# Patient Record
Sex: Female | Born: 1985 | Race: White | Hispanic: No | Marital: Single | Smoking: Current every day smoker
Health system: Southern US, Community
[De-identification: ages and names within clinical notes are randomized; demographics above are authoritative.]

## PROBLEM LIST (undated history)

## (undated) DIAGNOSIS — R519 Headache, unspecified: Secondary | ICD-10-CM

## (undated) DIAGNOSIS — I83819 Varicose veins of unspecified lower extremities with pain: Secondary | ICD-10-CM

## (undated) DIAGNOSIS — Z789 Other specified health status: Secondary | ICD-10-CM

## (undated) DIAGNOSIS — R011 Cardiac murmur, unspecified: Secondary | ICD-10-CM

## (undated) DIAGNOSIS — Z8759 Personal history of other complications of pregnancy, childbirth and the puerperium: Secondary | ICD-10-CM

## (undated) DIAGNOSIS — L309 Dermatitis, unspecified: Secondary | ICD-10-CM

## (undated) HISTORY — PX: WISDOM TOOTH EXTRACTION: SHX21

## (undated) HISTORY — PX: NO PAST SURGERIES: SHX2092

## (undated) HISTORY — DX: Headache, unspecified: R51.9

---
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
## 2012-01-17 ENCOUNTER — Emergency Department (HOSPITAL_COMMUNITY): Payer: 59

## 2012-01-17 ENCOUNTER — Emergency Department (HOSPITAL_COMMUNITY)
Admission: EM | Admit: 2012-01-17 | Discharge: 2012-01-17 | Disposition: A | Discharge status: Home / Self Care | Payer: 59 | Attending: Emergency Medicine | Admitting: Emergency Medicine

## 2012-01-17 ENCOUNTER — Encounter (HOSPITAL_COMMUNITY): Payer: Self-pay | Admitting: *Deleted

## 2012-01-17 DIAGNOSIS — R509 Fever, unspecified: Secondary | ICD-10-CM | POA: Insufficient documentation

## 2012-01-17 DIAGNOSIS — J029 Acute pharyngitis, unspecified: Secondary | ICD-10-CM | POA: Insufficient documentation

## 2012-01-17 DIAGNOSIS — F17200 Nicotine dependence, unspecified, uncomplicated: Secondary | ICD-10-CM | POA: Insufficient documentation

## 2012-01-17 MED ORDER — DEXAMETHASONE SODIUM PHOSPHATE 4 MG/ML IJ SOLN
8.0000 mg | Freq: Once | INTRAMUSCULAR | Status: AC
  Administered 2012-01-17: 8 mg via INTRAVENOUS
  Filled 2012-01-17: qty 2

## 2012-01-17 MED ORDER — PENICILLIN G BENZATHINE & PROC 1200000 UNIT/2ML IM SUSP
1.2000 10*6.[IU] | Freq: Once | INTRAMUSCULAR | Status: AC
  Administered 2012-01-17: 1.2 10*6.[IU] via INTRAMUSCULAR
  Filled 2012-01-17 (×2): qty 2

## 2012-01-17 MED ORDER — IOHEXOL 300 MG/ML  SOLN
75.0000 mL | Freq: Once | INTRAMUSCULAR | Status: AC | PRN
  Administered 2012-01-17: 75 mL via INTRAVENOUS

## 2012-01-17 NOTE — ED Notes (Signed)
Reports having fever since Monday, throat started hurting last night. [facility], tested negative for strep but sent here for possible abscess, airway intact at triage.

## 2012-01-17 NOTE — ED Notes (Signed)
PA at bedside.

## 2012-01-17 NOTE — ED Notes (Signed)
Medication received from pharmacy. Correct label, wrong medication. Pharmacy contacted.

## 2012-01-17 NOTE — ED Notes (Signed)
Pharmacy contacted for update on injection.

## 2012-01-17 NOTE — ED Notes (Signed)
Report given to Lillia Abed, RN for patient to get antibiotic injection once up from pharmacy.

## 2012-01-17 NOTE — ED Provider Notes (Signed)
History     CSN: [id number]  Arrival date & time 01/17/12  4098   First MD Initiated Contact with Patient 01/17/12 0935      Chief Complaint  Patient presents with  . Sore Throat    (Consider location/radiation/quality/duration/timing/severity/associated sxs/prior treatment) HPI History from patient. 26 year old female who presents with complaint of sore throat. She was seen at urgent care this morning and instructed to present to the emergency department for possible peritonsillar abscess. She had a rapid strep performed at urgent care which she reports was negative. She states that she has had intermittent fevers with MAXIMUM TEMPERATURE of 102 since Monday night, associated with decreased appetite. She did not develop sore throat until yesterday evening. She states it is painful to swallow but she is able to do so. Denies any difficulty breathing. She has had strep as a child but has not had in several years. She denies any associated nausea, vomiting, or abdominal pain. No cough or congestion.  History reviewed. No pertinent past medical history.  History reviewed. No pertinent past surgical history.  History reviewed. No pertinent family history.  History  Substance Use Topics  . Smoking status: Current Everyday Smoker    Types: Cigarettes  . Smokeless tobacco: Not on file  . Alcohol Use: No    OB History    Grav Para Term Preterm Abortions TAB SAB Ect Mult Living                  Review of Systems review of systems as per history of present illness  Allergies  Review of patient's allergies indicates no known allergies.  Home Medications   Current Outpatient Rx  Name Route Sig Dispense Refill  . ETONOGESTREL 68 MG [place] IMPL Subcutaneous Inject 1 each into the skin once.    . IBUPROFEN 200 MG PO TABS Oral Take 800 mg by mouth every 8 (eight) hours as needed. For pain    . OVER THE COUNTER MEDICATION Oral Take 30 mLs by mouth every 4 (four) hours as needed. Cough and  cold medication for cold symptoms      BP 118/72  Pulse 96  Temp 99.5 F (37.5 C) (Oral)  Resp 18  SpO2 97%  LMP 09/08/2011  Physical Exam  Nursing note and vitals reviewed. Constitutional: She appears well-developed and well-nourished. No distress.  HENT:  Head: Normocephalic and atraumatic.       Bilateral tonsillar edema with right slightly greater than left. Bilateral exudates and erythema. Uvula is midline. No stridor noted. Patient handling secretions. No acute distress.  Eyes:       Normal appearance  Neck: Normal range of motion. No tracheal deviation present.  Cardiovascular: Normal rate, regular rhythm and normal heart sounds.   Pulmonary/Chest: Effort normal and breath sounds normal. She exhibits no tenderness.  Abdominal: Soft. There is no tenderness.  Musculoskeletal: Normal range of motion.  Lymphadenopathy:    She has cervical adenopathy.  Neurological: She is alert.  Skin: Skin is warm and dry. She is not diaphoretic.  Psychiatric: She has a normal mood and affect.    ED Course  Procedures (including critical care time)  Labs Reviewed - No data to display Ct Soft Tissue Neck W Contrast  01/17/2012  *RADIOLOGY REPORT*  Clinical Data: Fever.  Difficulty swallowing.  Throat pain.  CT NECK WITH CONTRAST  Technique:  Multidetector CT imaging of the neck was performed with intravenous contrast.  Contrast: 75mL OMNIPAQUE IOHEXOL 300 MG/ML  SOLN  Comparison:  None.  Findings: Prominence of the adenoids and tonsillar pillars noted bilaterally, with moderate prominence of the palatine tonsils and mild lingual tonsillar prominence effacing a portion of the oral airway.  The nasal airway appears patent.  The epiglottis and aryepiglottic folds appear normal, and no prevertebral/retropharyngeal phlegmon or abscess is observed.  No tonsillar abscess is noted and the parapharyngeal spaces appear symmetric.  Numerous reactive lymph nodes are present in the internal jugular chain and  posterior cervical triangles.  Thyroid gland unremarkable.  Slight stranding in the upper mediastinum is observed and is technically nonspecific but may represent residual thymic tissue, with inflammatory process possible but less likely.  Broad and / salivary glands appear symmetric.  Paranasal sinuses and mastoid air cells appear clear.  No significant dental disease observed.  IMPRESSION:  1.  Abnormal prominence of the tonsillar pillars, adenoids, and palatine tonsils bilaterally.  This effaces the oral airway although the nasal airway is patent.  No tonsillar abscess or retropharyngeal/prevertebral abscess noted. 2.  Faint stranding in the anterior upper mediastinum is probably from thymic tissue, with inflammatory process considered less likely.  This area is only partially included.  If definitive workup of the mediastinum is warranted, CT the chest would be recommended. 3.  Mild reactive adenopathy in the neck.  Original Report Authenticated By: Dellia Cloud, M.D.     1. Pharyngitis       MDM  Patient presents with fever and sore throat since last evening. Rapid strep performed at urgent care was reportedly negative. Based on tonsillar edema, CT of the soft tissues of the neck was performed. This is negative for a peritonsillar or retropharyngeal abscess. As patient has not had any coughing or congestion, reports fevers at home, has exudates on exam, she will be presumptively treated for strep. Dose of IM Bicillin given prior to discharge. Reasons to return including signs and symptoms of airway compromise discussed. Patient verbalized understanding and agreed to plan.        Grant Fontana, PA-C 01/17/12 1525

## 2012-01-18 NOTE — ED Provider Notes (Signed)
Medical screening examination/treatment/procedure(s) were performed by non-physician practitioner and as supervising physician I was immediately available for consultation/collaboration.   Nashay Brickley, MD 01/18/12 1332 

## 2012-10-08 ENCOUNTER — Ambulatory Visit (INDEPENDENT_AMBULATORY_CARE_PROVIDER_SITE_OTHER): Payer: 59 | Admitting: Family Medicine

## 2012-10-08 DIAGNOSIS — Z111 Encounter for screening for respiratory tuberculosis: Secondary | ICD-10-CM

## 2012-10-08 MED ORDER — TUBERCULIN PPD 5 UNIT/0.1ML ID SOLN
5.0000 [IU] | Freq: Once | INTRADERMAL | Status: AC
  Administered 2012-10-08: 5 [IU] via INTRADERMAL

## 2012-10-10 ENCOUNTER — Ambulatory Visit: Payer: 59 | Admitting: Family Medicine

## 2012-10-11 ENCOUNTER — Encounter (INDEPENDENT_AMBULATORY_CARE_PROVIDER_SITE_OTHER): Payer: 59 | Admitting: Family Medicine

## 2012-10-11 DIAGNOSIS — Z111 Encounter for screening for respiratory tuberculosis: Secondary | ICD-10-CM

## 2012-10-11 LAB — READ PPD: TB Skin Test: NEGATIVE

## 2012-10-12 NOTE — Progress Notes (Signed)
This encounter was created in error - please disregard. This encounter was created in error - please disregard. This encounter was created in error - please disregard. 

## 2012-10-12 NOTE — Progress Notes (Signed)
This encounter was created in error - please disregard.

## 2012-10-23 ENCOUNTER — Emergency Department (HOSPITAL_COMMUNITY): Payer: 59

## 2012-10-23 ENCOUNTER — Observation Stay (HOSPITAL_COMMUNITY)
Admission: EM | Admit: 2012-10-23 | Discharge: 2012-10-24 | Disposition: A | Discharge status: Home / Self Care | Payer: 59 | Attending: General Surgery | Admitting: General Surgery

## 2012-10-23 ENCOUNTER — Encounter (HOSPITAL_COMMUNITY): Payer: Self-pay | Admitting: Family Medicine

## 2012-10-23 DIAGNOSIS — S20219A Contusion of unspecified front wall of thorax, initial encounter: Principal | ICD-10-CM | POA: Insufficient documentation

## 2012-10-23 DIAGNOSIS — R51 Headache: Secondary | ICD-10-CM | POA: Insufficient documentation

## 2012-10-23 DIAGNOSIS — IMO0001 Reserved for inherently not codable concepts without codable children: Secondary | ICD-10-CM | POA: Insufficient documentation

## 2012-10-23 DIAGNOSIS — M2550 Pain in unspecified joint: Secondary | ICD-10-CM | POA: Insufficient documentation

## 2012-10-23 DIAGNOSIS — F17200 Nicotine dependence, unspecified, uncomplicated: Secondary | ICD-10-CM | POA: Insufficient documentation

## 2012-10-23 DIAGNOSIS — R079 Chest pain, unspecified: Secondary | ICD-10-CM | POA: Diagnosis present

## 2012-10-23 DIAGNOSIS — M25569 Pain in unspecified knee: Secondary | ICD-10-CM | POA: Diagnosis not present

## 2012-10-23 DIAGNOSIS — S4362XA Sprain of left sternoclavicular joint, initial encounter: Secondary | ICD-10-CM

## 2012-10-23 DIAGNOSIS — S161XXA Strain of muscle, fascia and tendon at neck level, initial encounter: Secondary | ICD-10-CM

## 2012-10-23 DIAGNOSIS — S139XXA Sprain of joints and ligaments of unspecified parts of neck, initial encounter: Secondary | ICD-10-CM

## 2012-10-23 DIAGNOSIS — S301XXA Contusion of abdominal wall, initial encounter: Secondary | ICD-10-CM

## 2012-10-23 HISTORY — DX: Other specified health status: Z78.9

## 2012-10-23 LAB — POCT I-STAT, CHEM 8
BUN: 12 mg/dL (ref 6–23)
Calcium, Ion: 1.23 mmol/L (ref 1.12–1.23)
Chloride: 107 mEq/L (ref 96–112)
Glucose, Bld: 104 mg/dL — ABNORMAL HIGH (ref 70–99)
HCT: 44 % (ref 36.0–46.0)
TCO2: 26 mmol/L (ref 0–100)

## 2012-10-23 LAB — HCG, SERUM, QUALITATIVE: Preg, Serum: NEGATIVE

## 2012-10-23 MED ORDER — ONDANSETRON HCL 8 MG PO TABS
4.0000 mg | ORAL_TABLET | Freq: Four times a day (QID) | ORAL | Status: DC | PRN
  Filled 2012-10-23: qty 0.5

## 2012-10-23 MED ORDER — MORPHINE SULFATE 4 MG/ML IJ SOLN
4.0000 mg | Freq: Once | INTRAMUSCULAR | Status: AC
  Administered 2012-10-23: 4 mg via INTRAVENOUS
  Filled 2012-10-23: qty 1

## 2012-10-23 MED ORDER — MORPHINE SULFATE 2 MG/ML IJ SOLN
4.0000 mg | INTRAMUSCULAR | Status: DC | PRN
  Administered 2012-10-23 – 2012-10-24 (×3): 4 mg via INTRAVENOUS
  Filled 2012-10-23 (×3): qty 2

## 2012-10-23 MED ORDER — PANTOPRAZOLE SODIUM 40 MG PO TBEC: 40.0000 mg | DELAYED_RELEASE_TABLET | Freq: Every day | ORAL | Status: DC

## 2012-10-23 MED ORDER — ONDANSETRON HCL 4 MG/2ML IJ SOLN: 4.0000 mg | Freq: Four times a day (QID) | INTRAMUSCULAR | Status: DC | PRN

## 2012-10-23 MED ORDER — IOHEXOL 300 MG/ML  SOLN
100.0000 mL | Freq: Once | INTRAMUSCULAR | Status: AC | PRN
  Administered 2012-10-23: 100 mL via INTRAVENOUS

## 2012-10-23 MED ORDER — PNEUMOCOCCAL VAC POLYVALENT 25 MCG/0.5ML IJ INJ
0.5000 mL | INJECTION | INTRAMUSCULAR | Status: DC
  Filled 2012-10-23: qty 0.5

## 2012-10-23 MED ORDER — PANTOPRAZOLE SODIUM 40 MG IV SOLR
40.0000 mg | Freq: Every day | INTRAVENOUS | Status: DC
  Filled 2012-10-23: qty 40
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
## 2012-10-23 NOTE — ED Notes (Addendum)
Surgeon at bedside.  
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
## 2012-10-23 NOTE — ED Notes (Signed)
Per PA Hannah patient acuity increased to 3 due to patient's status at assessment.

## 2012-10-24 DIAGNOSIS — S20219A Contusion of unspecified front wall of thorax, initial encounter: Secondary | ICD-10-CM | POA: Diagnosis not present

## 2012-10-24 DIAGNOSIS — S301XXA Contusion of abdominal wall, initial encounter: Secondary | ICD-10-CM

## 2012-10-24 DIAGNOSIS — S161XXA Strain of muscle, fascia and tendon at neck level, initial encounter: Secondary | ICD-10-CM

## 2012-10-24 DIAGNOSIS — S4362XA Sprain of left sternoclavicular joint, initial encounter: Secondary | ICD-10-CM

## 2012-10-24 LAB — CBC
HCT: 39 % (ref 36.0–46.0)
MCHC: 34.6 g/dL (ref 30.0–36.0)
MCV: 87.6 fL (ref 78.0–100.0)
Platelets: 252 10*3/uL (ref 150–400)
RDW: 13 % (ref 11.5–15.5)
WBC: 7.9 10*3/uL (ref 4.0–10.5)

## 2012-10-24 LAB — BASIC METABOLIC PANEL
BUN: 10 mg/dL (ref 6–23)
Creatinine, Ser: 0.84 mg/dL (ref 0.50–1.10)
GFR calc Af Amer: >90 mL/min (ref >90)
GFR calc non Af Amer: >90 mL/min (ref >90)

## 2012-10-24 MED ORDER — HYDROCODONE-ACETAMINOPHEN 5-325 MG PO TABS: 1.0000 | ORAL_TABLET | ORAL | Status: DC | PRN

## 2012-10-24 MED ORDER — NAPROXEN 500 MG PO TABS: 500.0000 mg | ORAL_TABLET | Freq: Two times a day (BID) | ORAL | Status: DC

## 2012-10-24 MED ORDER — METHOCARBAMOL 500 MG PO TABS: 500.0000 mg | ORAL_TABLET | Freq: Four times a day (QID) | ORAL | Status: DC | PRN

## 2012-10-24 NOTE — Progress Notes (Signed)
Pt discharged to home
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
## 2012-10-24 NOTE — Discharge Summary (Signed)
Patric Vanpelt, MD, MPH, FACS Pager: [phone]  

## 2012-10-24 NOTE — Progress Notes (Signed)
Doing well, ready for D/C Violeta Gelinas, MD, MPH, FACS Pager: [phone]

## 2012-10-24 NOTE — ED Provider Notes (Signed)
Medical screening examination/treatment/procedure(s) were performed by non-physician practitioner and as supervising physician I was immediately available for consultation/collaboration.   [name], DO 10/24/12 1535

## 2012-11-01 ENCOUNTER — Ambulatory Visit
Admission: RE | Admit: 2012-11-01 | Discharge: 2012-11-01 | Disposition: A | Discharge status: Home / Self Care | Payer: 59 | Source: Ambulatory Visit | Attending: Family Medicine | Admitting: Family Medicine

## 2012-11-01 ENCOUNTER — Encounter: Payer: Self-pay | Admitting: Family Medicine

## 2012-11-01 ENCOUNTER — Ambulatory Visit (INDEPENDENT_AMBULATORY_CARE_PROVIDER_SITE_OTHER): Payer: 59 | Admitting: Family Medicine

## 2012-11-01 VITALS — BP 130/70 | HR 80 | Temp 98.6°F | Resp 18 | Wt 226.0 lb

## 2012-11-01 DIAGNOSIS — M549 Dorsalgia, unspecified: Secondary | ICD-10-CM

## 2012-11-01 DIAGNOSIS — M533 Sacrococcygeal disorders, not elsewhere classified: Secondary | ICD-10-CM

## 2012-11-01 DIAGNOSIS — M25552 Pain in left hip: Secondary | ICD-10-CM

## 2012-11-01 DIAGNOSIS — M542 Cervicalgia: Secondary | ICD-10-CM

## 2012-11-01 DIAGNOSIS — M25559 Pain in unspecified hip: Secondary | ICD-10-CM
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
## 2012-11-06 NOTE — Progress Notes (Signed)
done

## 2012-11-11 ENCOUNTER — Telehealth: Payer: Self-pay | Admitting: Family Medicine

## 2012-11-11 NOTE — Telephone Encounter (Signed)
Patient aware.
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
## 2012-11-12 ENCOUNTER — Other Ambulatory Visit: Payer: Self-pay | Admitting: Family Medicine

## 2012-11-12 MED ORDER — HYDROCODONE-ACETAMINOPHEN 5-325 MG PO TABS: 1.0000 | ORAL_TABLET | ORAL | Status: DC | PRN

## 2012-11-12 MED ORDER — METHOCARBAMOL 500 MG PO TABS: 500.0000 mg | ORAL_TABLET | Freq: Four times a day (QID) | ORAL | Status: DC | PRN

## 2012-11-12 NOTE — Telephone Encounter (Signed)
Rx Refilled  

## 2013-05-15 ENCOUNTER — Other Ambulatory Visit: Payer: Self-pay

## 2013-06-30 ENCOUNTER — Emergency Department (HOSPITAL_COMMUNITY)
Admission: EM | Admit: 2013-06-30 | Discharge: 2013-06-30 | Disposition: A | Discharge status: Home / Self Care | Payer: 59 | Source: Home / Self Care | Attending: Emergency Medicine | Admitting: Emergency Medicine

## 2013-06-30 ENCOUNTER — Encounter (HOSPITAL_COMMUNITY): Payer: Self-pay | Admitting: Emergency Medicine

## 2013-06-30 DIAGNOSIS — J069 Acute upper respiratory infection, unspecified: Secondary | ICD-10-CM

## 2013-06-30 DIAGNOSIS — J209 Acute bronchitis, unspecified: Secondary | ICD-10-CM

## 2013-06-30 DIAGNOSIS — J019 Acute sinusitis, unspecified: Secondary | ICD-10-CM

## 2013-06-30 MED ORDER — HYDROCOD POLST-CHLORPHEN POLST 10-8 MG/5ML PO LQCR: 5.0000 mL | Freq: Two times a day (BID) | ORAL | Status: DC | PRN

## 2013-06-30 MED ORDER — AMOXICILLIN-POT CLAVULANATE 875-125 MG PO TABS: 1.0000 | ORAL_TABLET | Freq: Two times a day (BID) | ORAL | Status: DC

## 2013-06-30 NOTE — ED Notes (Signed)
Pt  Reports      Symptoms       Of  Congested         With  Drainage        From  Sinuses           Pt reports       scrathty  Throat               Drainage  With  Sweating        Symptoms  X  1  Week             Pt  Reports  The  Symptoms           Not releived  By   otc  meds          The  Pt  Is  Sitting upright        On  Exam  Room  In  No  Acute  distress

## 2013-06-30 NOTE — ED Provider Notes (Signed)
Chief Complaint:   Chief Complaint  Patient presents with  . Sore Throat    History of Present Illness:   Kaylee Brown is a 27 year old female who's had a one-week history of sore throat, felt weak and tired, and cough productive of greenish, bloody drainage, chest pain, chills, nasal congestion, sneezing, rhinorrhea with green drainage, headache, sinus pressure, ear congestion, watery eyes, and nausea. She denies any wheezing, vomiting, or diarrhea. She's had no known exposures.  Review of Systems:  Other than noted above, the patient denies any of the following symptoms: Systemic:  No fevers, chills, sweats, weight loss or gain, fatigue, or tiredness. Eye:  No redness or discharge. ENT:  No ear pain, drainage, headache, nasal congestion, drainage, sinus pressure, difficulty swallowing, or sore throat. Neck:  No neck pain or swollen glands. Lungs:  No cough, sputum production, hemoptysis, wheezing, chest tightness, shortness of breath or chest pain. GI:  No abdominal pain, nausea, vomiting or diarrhea.  PMFSH:  Past medical history, family history, social history, meds, and allergies were reviewed.  Physical Exam:   Vital signs:  BP 130/77  Pulse 83  Temp(Src) 98.2 F (36.8 C) (Oral)  Resp 16  SpO2 100%  LMP 05/31/2013 General:  Alert and oriented.  In no distress.  Skin warm and dry. Eye:  No conjunctival injection or drainage. Lids were normal. ENT:  TMs and canals were normal, without erythema or inflammation.  Nasal mucosa was clear and uncongested, without drainage.  Mucous membranes were moist.  Tonsils were enlarged and red with spots of white exudate.  There were no oral ulcerations or lesions. Neck:  Supple, no adenopathy, tenderness or mass. Lungs:  No respiratory distress.  Lungs were clear to auscultation, without wheezes, rales or rhonchi.  Breath sounds were clear and equal bilaterally.  Heart:  Regular rhythm, without gallops, murmers or rubs. Skin:  Clear, warm,  and dry, without rash or lesions.  Labs:   Results for orders placed during the hospital encounter of 06/30/13  POCT RAPID STREP A ([facility] ONLY)      Result Value Range   Streptococcus, Group A Screen (Direct) NEGATIVE  NEGATIVE    Assessment:  The primary encounter diagnosis was Viral upper respiratory infection. Diagnoses of Acute sinusitis and Acute bronchitis were also pertinent to this visit.  Plan:   1.  Meds:  The following meds were prescribed:   Discharge Medication List as of 06/30/2013  5:35 PM    START taking these medications   Details  amoxicillin-clavulanate (AUGMENTIN) 875-125 MG per tablet Take 1 tablet by mouth 2 (two) times daily., Starting 06/30/2013, Until Discontinued, Normal    chlorpheniramine-HYDROcodone (TUSSIONEX) 10-8 MG/5ML LQCR Take 5 mLs by mouth every 12 (twelve) hours as needed for cough., Starting 06/30/2013, Until Discontinued, Normal        2.  Patient Education/Counseling:  The patient was given appropriate handouts, self care instructions, and instructed in symptomatic relief.  Advised extra rest and fluids and suggested decongestants, saline spray, and inhaling steam, and sleeping with head elevated.  3.  Follow up:  The patient was told to follow up if no better in 3 to 4 days, if becoming worse in any way, and given some red flag symptoms such as increasing fever or difficulty breathing which would prompt immediate return.  Follow up here if necessary.      Reuben Likes, MD 06/30/13 [phone]

## 2013-07-02 LAB — CULTURE, GROUP A STREP

## 2013-07-08 ENCOUNTER — Telehealth (HOSPITAL_COMMUNITY): Payer: Self-pay | Admitting: *Deleted

## 2013-07-08 NOTE — ED Notes (Signed)
Pt.'s mother called on VM and said her insurance does not cover the Augmentin or Tussin D and asked for generic.  I called back and told her any of our Rx.'s can be filled as generic and the generic name is on the Rx. in parenthesis-amoxicillin-clauvanate.  I told her that if the insurance does not cover the cough medicine, then use OTC with D which is for decongestant. She said that is what she has been doing that and will call the pharmacist back. Kaylee Brown 07/08/2013

## 2014-04-24 ENCOUNTER — Other Ambulatory Visit: Payer: Self-pay
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
## 2014-11-15 ENCOUNTER — Emergency Department (HOSPITAL_COMMUNITY)
Admission: EM | Admit: 2014-11-15 | Discharge: 2014-11-15 | Disposition: A | Discharge status: Home / Self Care | Payer: Managed Care, Other (non HMO) | Attending: Emergency Medicine | Admitting: Emergency Medicine

## 2014-11-15 ENCOUNTER — Encounter (HOSPITAL_COMMUNITY): Payer: Self-pay | Admitting: Emergency Medicine

## 2014-11-15 DIAGNOSIS — Z720 Tobacco use: Secondary | ICD-10-CM | POA: Diagnosis not present

## 2014-11-15 DIAGNOSIS — Z87828 Personal history of other (healed) physical injury and trauma: Secondary | ICD-10-CM | POA: Insufficient documentation

## 2014-11-15 DIAGNOSIS — J029 Acute pharyngitis, unspecified: Secondary | ICD-10-CM

## 2014-11-15 DIAGNOSIS — Z3202 Encounter for pregnancy test, result negative: Secondary | ICD-10-CM | POA: Diagnosis not present

## 2014-11-15 DIAGNOSIS — R509 Fever, unspecified: Secondary | ICD-10-CM | POA: Diagnosis present

## 2014-11-15 LAB — POC URINE PREG, ED: PREG TEST UR: NEGATIVE

## 2014-11-15 LAB — RAPID STREP SCREEN (MED CTR MEBANE ONLY): STREPTOCOCCUS, GROUP A SCREEN (DIRECT): NEGATIVE

## 2014-11-15 MED ORDER — DEXAMETHASONE 4 MG PO TABS
10.0000 mg | ORAL_TABLET | Freq: Once | ORAL | Status: AC
  Administered 2014-11-15: 10 mg via ORAL
  Filled 2014-11-15: qty 2
  Filled 2014-11-15: qty 1
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
## 2014-11-15 NOTE — Discharge Instructions (Signed)

## 2014-11-15 NOTE — ED Notes (Addendum)
Pt from home c/o sore throat and fever x 2 days. She has been taking nyquil without relief. Right sided throat swelling.

## 2014-11-17 LAB — CULTURE, GROUP A STREP

## 2015-12-07 ENCOUNTER — Ambulatory Visit (INDEPENDENT_AMBULATORY_CARE_PROVIDER_SITE_OTHER): Payer: Managed Care, Other (non HMO) | Admitting: Sports Medicine

## 2015-12-07 ENCOUNTER — Encounter: Payer: Self-pay | Admitting: Sports Medicine

## 2015-12-07 ENCOUNTER — Ambulatory Visit: Payer: Managed Care, Other (non HMO)

## 2015-12-07 VITALS — BP 113/74 | HR 76 | Resp 12

## 2015-12-07 DIAGNOSIS — M5430 Sciatica, unspecified side: Secondary | ICD-10-CM

## 2015-12-07 DIAGNOSIS — M79673 Pain in unspecified foot: Secondary | ICD-10-CM

## 2015-12-07 DIAGNOSIS — M7752 Other enthesopathy of left foot: Secondary | ICD-10-CM

## 2015-12-07 MED ORDER — TRIAMCINOLONE ACETONIDE 10 MG/ML IJ SUSP: 10.0000 mg | Freq: Once | INTRAMUSCULAR | Status: DC
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
## 2016-07-27 DIAGNOSIS — N926 Irregular menstruation, unspecified: Secondary | ICD-10-CM | POA: Insufficient documentation

## 2016-11-16 DIAGNOSIS — L309 Dermatitis, unspecified: Secondary | ICD-10-CM | POA: Diagnosis not present

## 2016-11-16 DIAGNOSIS — R109 Unspecified abdominal pain: Secondary | ICD-10-CM | POA: Diagnosis not present

## 2016-11-16 DIAGNOSIS — R197 Diarrhea, unspecified: Secondary | ICD-10-CM | POA: Diagnosis not present

## 2016-11-17 DIAGNOSIS — R197 Diarrhea, unspecified: Secondary | ICD-10-CM | POA: Diagnosis not present

## 2016-12-19 DIAGNOSIS — R197 Diarrhea, unspecified: Secondary | ICD-10-CM | POA: Diagnosis not present

## 2016-12-21 DIAGNOSIS — L280 Lichen simplex chronicus: Secondary | ICD-10-CM | POA: Diagnosis not present

## 2016-12-21 DIAGNOSIS — L309 Dermatitis, unspecified: Secondary | ICD-10-CM | POA: Diagnosis not present

## 2017-05-03 ENCOUNTER — Encounter (HOSPITAL_COMMUNITY): Payer: Self-pay | Admitting: Emergency Medicine

## 2017-05-03 DIAGNOSIS — M79604 Pain in right leg: Secondary | ICD-10-CM | POA: Diagnosis not present

## 2017-05-03 DIAGNOSIS — R51 Headache: Secondary | ICD-10-CM | POA: Diagnosis not present

## 2017-05-03 DIAGNOSIS — R21 Rash and other nonspecific skin eruption: Secondary | ICD-10-CM | POA: Diagnosis not present

## 2017-05-03 DIAGNOSIS — F17210 Nicotine dependence, cigarettes, uncomplicated: Secondary | ICD-10-CM | POA: Diagnosis not present

## 2017-05-03 LAB — COMPREHENSIVE METABOLIC PANEL
ALK PHOS: 40 U/L (ref 38–126)
ALT: 12 U/L — AB (ref 14–54)
AST: 16 U/L (ref 15–41)
Albumin: 4.1 g/dL (ref 3.5–5.0)
Anion gap: 9 (ref 5–15)
BUN: 13 mg/dL (ref 6–20)
CALCIUM: 9 mg/dL (ref 8.9–10.3)
CHLORIDE: 103 mmol/L (ref 101–111)
CO2: 26 mmol/L (ref 22–32)
CREATININE: 0.76 mg/dL (ref 0.44–1.00)
GFR calc Af Amer: >60 mL/min (ref >60)
Glucose, Bld: 99 mg/dL (ref 65–99)
Potassium: 3.8 mmol/L (ref 3.5–5.1)
Sodium: 138 mmol/L (ref 135–145)
Total Bilirubin: 0.2 mg/dL — ABNORMAL LOW (ref 0.3–1.2)
Total Protein: 7.4 g/dL (ref 6.5–8.1)

## 2017-05-03 LAB — CBC WITH DIFFERENTIAL/PLATELET
BASOS PCT: 1 %
Basophils Absolute: 0.1 10*3/uL (ref 0.0–0.1)
EOS ABS: 0.3 10*3/uL (ref 0.0–0.7)
EOS PCT: 4 %
HCT: 39.2 % (ref 36.0–46.0)
HEMOGLOBIN: 13.2 g/dL (ref 12.0–15.0)
Lymphocytes Relative: 39 %
Lymphs Abs: 3 10*3/uL (ref 0.7–4.0)
MCH: 31.3 pg (ref 26.0–34.0)
MCHC: 33.7 g/dL (ref 30.0–36.0)
MCV: 92.9 fL (ref 78.0–100.0)
MONOS PCT: 8 %
Monocytes Absolute: 0.6 10*3/uL (ref 0.1–1.0)
NEUTROS PCT: 48 %
Neutro Abs: 3.7 10*3/uL (ref 1.7–7.7)
PLATELETS: 314 10*3/uL (ref 150–400)
RBC: 4.22 MIL/uL (ref 3.87–5.11)
RDW: 13.1 % (ref 11.5–15.5)
WBC: 7.7 10*3/uL (ref 4.0–10.5)

## 2017-05-03 LAB — URINALYSIS, ROUTINE W REFLEX MICROSCOPIC
Bilirubin Urine: NEGATIVE
Glucose, UA: NEGATIVE mg/dL
HGB URINE DIPSTICK: NEGATIVE
KETONES UR: NEGATIVE mg/dL
Leukocytes, UA: NEGATIVE
NITRITE: NEGATIVE
PROTEIN: NEGATIVE mg/dL
SPECIFIC GRAVITY, URINE: 1.026 (ref 1.005–1.030)
pH: 5 (ref 5.0–8.0)

## 2017-05-03 LAB — I-STAT CG4 LACTIC ACID, ED: Lactic Acid, Venous: 1.17 mmol/L (ref 0.5–1.9)

## 2017-05-03 NOTE — ED Triage Notes (Signed)
Pt c/o irritated area to the right shin x 2-3 years. area is circular, red with raised bumps. Pt has seen a dermatologist and given steroid creams without relief. Area is warm to the touch, foot is warm, pulses palpable to the right foot. Hx eczema.

## 2017-05-04 ENCOUNTER — Ambulatory Visit (HOSPITAL_BASED_OUTPATIENT_CLINIC_OR_DEPARTMENT_OTHER)
Admission: RE | Admit: 2017-05-04 | Discharge: 2017-05-04 | Disposition: A | Discharge status: Home / Self Care | Payer: BLUE CROSS/BLUE SHIELD | Source: Ambulatory Visit | Attending: Emergency Medicine | Admitting: Emergency Medicine

## 2017-05-04 ENCOUNTER — Emergency Department (HOSPITAL_COMMUNITY)
Admission: EM | Admit: 2017-05-04 | Discharge: 2017-05-04 | Disposition: A | Discharge status: Home / Self Care | Payer: BLUE CROSS/BLUE SHIELD | Attending: Emergency Medicine | Admitting: Emergency Medicine

## 2017-05-04 ENCOUNTER — Emergency Department (HOSPITAL_COMMUNITY): Payer: BLUE CROSS/BLUE SHIELD

## 2017-05-04 DIAGNOSIS — M7989 Other specified soft tissue disorders: Secondary | ICD-10-CM

## 2017-05-04 DIAGNOSIS — R51 Headache: Secondary | ICD-10-CM

## 2017-05-04 DIAGNOSIS — R519 Headache, unspecified: Secondary | ICD-10-CM

## 2017-05-04 DIAGNOSIS — M79609 Pain in unspecified limb: Secondary | ICD-10-CM | POA: Diagnosis not present

## 2017-05-04 DIAGNOSIS — M79604 Pain in right leg: Secondary | ICD-10-CM

## 2017-05-04 MED ORDER — METOCLOPRAMIDE HCL 10 MG PO TABS
10.0000 mg | ORAL_TABLET | Freq: Once | ORAL | Status: AC
  Administered 2017-05-04: 10 mg via ORAL
  Filled 2017-05-04: qty 1

## 2017-05-04 MED ORDER — ACETAMINOPHEN 500 MG PO TABS: 1000.0000 mg | ORAL_TABLET | Freq: Once | ORAL | Status: DC

## 2017-05-04 MED ORDER — DIPHENHYDRAMINE HCL 25 MG PO CAPS
25.0000 mg | ORAL_CAPSULE | Freq: Once | ORAL | Status: AC
  Administered 2017-05-04: 25 mg via ORAL
  Filled 2017-05-04: qty 1

## 2017-05-04 MED ORDER — KETOROLAC TROMETHAMINE 30 MG/ML IJ SOLN
30.0000 mg | Freq: Once | INTRAMUSCULAR | Status: AC
  Administered 2017-05-04: 30 mg via INTRAMUSCULAR
  Filled 2017-05-04: qty 1
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
## 2017-05-11 DIAGNOSIS — I8311 Varicose veins of right lower extremity with inflammation: Secondary | ICD-10-CM | POA: Diagnosis not present

## 2017-05-16 DIAGNOSIS — I8311 Varicose veins of right lower extremity with inflammation: Secondary | ICD-10-CM | POA: Diagnosis not present

## 2017-06-06 DIAGNOSIS — I8311 Varicose veins of right lower extremity with inflammation: Secondary | ICD-10-CM | POA: Diagnosis not present

## 2017-07-10 DIAGNOSIS — R05 Cough: Secondary | ICD-10-CM | POA: Diagnosis not present

## 2017-07-10 DIAGNOSIS — J069 Acute upper respiratory infection, unspecified: Secondary | ICD-10-CM | POA: Diagnosis not present

## 2017-08-17 DIAGNOSIS — Z01419 Encounter for gynecological examination (general) (routine) without abnormal findings: Secondary | ICD-10-CM | POA: Diagnosis not present

## 2017-08-17 DIAGNOSIS — Z1389 Encounter for screening for other disorder: Secondary | ICD-10-CM | POA: Diagnosis not present

## 2017-08-17 DIAGNOSIS — Z720 Tobacco use: Secondary | ICD-10-CM | POA: Diagnosis not present

## 2017-08-17 DIAGNOSIS — Z130 Encounter for screening for diseases of the blood and blood-forming organs and certain disorders involving the immune mechanism: Secondary | ICD-10-CM | POA: Diagnosis not present

## 2017-08-17 DIAGNOSIS — Z124 Encounter for screening for malignant neoplasm of cervix: Secondary | ICD-10-CM | POA: Diagnosis not present

## 2017-08-17 DIAGNOSIS — Z6832 Body mass index (BMI) 32.0-32.9, adult: Secondary | ICD-10-CM | POA: Diagnosis not present

## 2017-08-17 DIAGNOSIS — Z1151 Encounter for screening for human papillomavirus (HPV): Secondary | ICD-10-CM | POA: Diagnosis not present

## 2018-02-05 DIAGNOSIS — R05 Cough: Secondary | ICD-10-CM | POA: Diagnosis not present

## 2018-06-10 DIAGNOSIS — J0100 Acute maxillary sinusitis, unspecified: Secondary | ICD-10-CM | POA: Diagnosis not present

## 2018-06-10 DIAGNOSIS — R05 Cough: Secondary | ICD-10-CM | POA: Diagnosis not present

## 2018-11-19 DIAGNOSIS — O200 Threatened abortion: Secondary | ICD-10-CM | POA: Diagnosis not present

## 2018-11-21 DIAGNOSIS — O200 Threatened abortion: Secondary | ICD-10-CM | POA: Diagnosis not present

## 2018-12-03 DIAGNOSIS — O021 Missed abortion: Secondary | ICD-10-CM | POA: Diagnosis not present

## 2018-12-19 DIAGNOSIS — Z03818 Encounter for observation for suspected exposure to other biological agents ruled out: Secondary | ICD-10-CM | POA: Diagnosis not present

## 2019-01-20 DIAGNOSIS — M25552 Pain in left hip: Secondary | ICD-10-CM | POA: Diagnosis not present

## 2019-03-04 DIAGNOSIS — Q219 Congenital malformation of cardiac septum, unspecified: Secondary | ICD-10-CM | POA: Insufficient documentation

## 2019-03-04 DIAGNOSIS — Z3201 Encounter for pregnancy test, result positive: Secondary | ICD-10-CM | POA: Diagnosis not present

## 2019-03-04 DIAGNOSIS — N911 Secondary amenorrhea: Secondary | ICD-10-CM | POA: Diagnosis not present

## 2019-03-26 DIAGNOSIS — Z363 Encounter for antenatal screening for malformations: Secondary | ICD-10-CM | POA: Diagnosis not present

## 2019-03-26 DIAGNOSIS — Z113 Encounter for screening for infections with a predominantly sexual mode of transmission: Secondary | ICD-10-CM | POA: Diagnosis not present

## 2019-03-26 DIAGNOSIS — Z3682 Encounter for antenatal screening for nuchal translucency: Secondary | ICD-10-CM | POA: Diagnosis not present

## 2019-03-26 DIAGNOSIS — O26891 Other specified pregnancy related conditions, first trimester: Secondary | ICD-10-CM | POA: Diagnosis not present

## 2019-03-26 DIAGNOSIS — Z3A09 9 weeks gestation of pregnancy: Secondary | ICD-10-CM | POA: Diagnosis not present

## 2019-03-26 DIAGNOSIS — Z3689 Encounter for other specified antenatal screening: Secondary | ICD-10-CM | POA: Diagnosis not present

## 2019-03-26 LAB — OB RESULTS CONSOLE RPR: RPR: NONREACTIVE

## 2019-03-26 LAB — OB RESULTS CONSOLE ANTIBODY SCREEN: Antibody Screen: NEGATIVE

## 2019-03-26 LAB — OB RESULTS CONSOLE HIV ANTIBODY (ROUTINE TESTING): HIV: NONREACTIVE

## 2019-03-26 LAB — OB RESULTS CONSOLE ABO/RH: RH Type: POSITIVE

## 2019-03-26 LAB — OB RESULTS CONSOLE GC/CHLAMYDIA
Chlamydia: NEGATIVE
Gonorrhea: NEGATIVE

## 2019-03-26 LAB — OB RESULTS CONSOLE RUBELLA ANTIBODY, IGM: Rubella: NON-IMMUNE/NOT IMMUNE

## 2019-03-26 LAB — OB RESULTS CONSOLE HEPATITIS B SURFACE ANTIGEN: Hepatitis B Surface Ag: NEGATIVE

## 2019-04-18 DIAGNOSIS — Z3481 Encounter for supervision of other normal pregnancy, first trimester: Secondary | ICD-10-CM | POA: Diagnosis not present

## 2019-04-18 DIAGNOSIS — Z3482 Encounter for supervision of other normal pregnancy, second trimester: Secondary | ICD-10-CM | POA: Diagnosis not present

## 2019-04-28 DIAGNOSIS — R509 Fever, unspecified: Secondary | ICD-10-CM | POA: Diagnosis not present

## 2019-04-28 DIAGNOSIS — Z20828 Contact with and (suspected) exposure to other viral communicable diseases: Secondary | ICD-10-CM | POA: Diagnosis not present

## 2019-05-01 DIAGNOSIS — U071 COVID-19: Secondary | ICD-10-CM | POA: Diagnosis not present

## 2019-06-03 DIAGNOSIS — Z3A19 19 weeks gestation of pregnancy: Secondary | ICD-10-CM | POA: Diagnosis not present

## 2019-06-03 DIAGNOSIS — Z363 Encounter for antenatal screening for malformations: Secondary | ICD-10-CM | POA: Diagnosis not present

## 2019-06-27 ENCOUNTER — Inpatient Hospital Stay (HOSPITAL_BASED_OUTPATIENT_CLINIC_OR_DEPARTMENT_OTHER)
Admit: 2019-06-27 | Discharge: 2019-06-27 | Disposition: A | Discharge status: Home / Self Care | Payer: BC Managed Care – PPO | Attending: Advanced Practice Midwife | Admitting: Advanced Practice Midwife

## 2019-06-27 ENCOUNTER — Inpatient Hospital Stay (HOSPITAL_COMMUNITY)
Admission: AD | Admit: 2019-06-27 | Discharge: 2019-06-27 | Disposition: A | Discharge status: Home / Self Care | Payer: BC Managed Care – PPO | Attending: Obstetrics and Gynecology | Admitting: Obstetrics and Gynecology

## 2019-06-27 ENCOUNTER — Encounter (HOSPITAL_COMMUNITY): Payer: Self-pay | Admitting: Obstetrics and Gynecology

## 2019-06-27 ENCOUNTER — Other Ambulatory Visit: Payer: Self-pay

## 2019-06-27 DIAGNOSIS — M7989 Other specified soft tissue disorders: Secondary | ICD-10-CM

## 2019-06-27 DIAGNOSIS — Z3A22 22 weeks gestation of pregnancy: Secondary | ICD-10-CM

## 2019-06-27 DIAGNOSIS — M79609 Pain in unspecified limb: Secondary | ICD-10-CM

## 2019-06-27 DIAGNOSIS — M79661 Pain in right lower leg: Secondary | ICD-10-CM | POA: Insufficient documentation

## 2019-06-27 DIAGNOSIS — R600 Localized edema: Secondary | ICD-10-CM | POA: Insufficient documentation

## 2019-06-27 DIAGNOSIS — Z791 Long term (current) use of non-steroidal anti-inflammatories (NSAID): Secondary | ICD-10-CM | POA: Diagnosis not present

## 2019-06-27 DIAGNOSIS — O26892 Other specified pregnancy related conditions, second trimester: Secondary | ICD-10-CM | POA: Diagnosis not present

## 2019-06-27 DIAGNOSIS — I83891 Varicose veins of right lower extremities with other complications: Secondary | ICD-10-CM | POA: Diagnosis not present

## 2019-06-27 DIAGNOSIS — Z87891 Personal history of nicotine dependence: Secondary | ICD-10-CM | POA: Diagnosis not present

## 2019-06-27 HISTORY — DX: Varicose veins of unspecified lower extremity with pain: I83.819

## 2019-06-27 HISTORY — DX: Cardiac murmur, unspecified: R01.1

## 2019-06-27 HISTORY — DX: Dermatitis, unspecified: L30.9
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
## 2019-06-27 NOTE — Discharge Instructions (Signed)
How to Use Compression Stockings Compression stockings are elastic socks that squeeze the legs. They help increase blood flow (circulation) to the legs, decrease swelling in the legs, and reduce the chance of developing blood clots in the lower legs. Compression stockings are often used by people who:  Are recovering from surgery.  Have poor circulation in their legs.  Tend to get blood clots in their legs.  Have bulging (varicose) veins.  Sit or stay in bed for long periods of time. Follow instructions from your health care provider about how and when to wear your compression stockings. How to wear compression stockings Before you put on your compression stockings:  Make sure that they are the correct size and degree of compression. If you do not know your size or required grade of compression, ask your health care provider and follow the manufacturer's instructions that come with the stockings.  Make sure that they are clean, dry, and in good condition.  Check them for rips and tears. Do not put them on if they are ripped or torn. Put your stockings on first thing in the morning, before you get out of bed. Keep them on for as long as your health care provider advises. When you are wearing your stockings:  Keep them as smooth as possible. Do not allow them to bunch up. It is especially important to prevent the stockings from bunching up around your toes or behind your knees.  Do not roll the stockings downward and leave them rolled down. This can decrease blood flow to your leg.  Change them right away if they become wet or dirty. When you take off your stockings, inspect your legs and feet. Check for:  Open sores.  Red spots.  Swelling. General tips  Do not stop wearing compression stockings without talking to your health care provider first.  Wash your stockings every day with mild detergent in cold or warm water. Do not use bleach. Air-dry your stockings or dry them in a  clothes dryer on low heat. It may be helpful to have two pairs so that you have a pair to wear while the other is being washed.  Replace your stockings every 3-6 months.  If skin moisturizing is part of your treatment plan, apply lotion or cream at night so that your skin will be dry when you put on the stockings in the morning. It is harder to put the stockings on when you have lotion on your legs or feet.  Wear nonskid shoes or slip-resistant socks when walking while wearing compression stockings. Contact a health care provider and remove your stockings if you have:  A feeling of pins and needles in your feet or legs.  Open sores, red spots, or other skin changes on your feet or legs.  Swelling or pain that gets worse. Get help right away if you have:  Numbness or tingling in your lower legs that does not get better right after you take the stockings off.  Toes or feet that are unusually cold or turn a bluish color.  A warm or red area on your leg.  New swelling or soreness in your leg.  Shortness of breath.  Chest pain.  A fast or irregular heartbeat.  Light-headedness.  Dizziness. Summary  Compression stockings are elastic socks that squeeze the legs.  They help increase blood flow (circulation) to the legs, decrease swelling in the legs, and reduce the chance of developing blood clots in the lower legs.  Follow instructions  from your health care provider about how and when to wear your compression stockings.  Do not stop wearing your compression stockings without talking to your health care provider first. This information is not intended to replace advice given to you by your health care provider. Make sure you discuss any questions you have with your health care provider. Document Released: 04/23/2009 Document Revised: 06/28/2017 Document Reviewed: 06/28/2017 Elsevier Patient Education  2020 [employer].

## 2019-06-27 NOTE — MAU Note (Signed)
Past few days when she has gotten off work, her varicose veins has been more swollen, painful.   Area above rt inner ankle, is swollen and mottled/bruised  Appearance, popped up in the last couple days. Has had legs elevated since got off last night.

## 2019-06-27 NOTE — Progress Notes (Signed)
Right lower extremity venous duplex completed. Refer to "CV Proc" under chart review to view preliminary results.  06/27/2019 4:33 PM Kelby Aline., MHA, RVT, RDCS, RDMS

## 2019-06-30 DIAGNOSIS — Z362 Encounter for other antenatal screening follow-up: Secondary | ICD-10-CM | POA: Diagnosis not present

## 2019-06-30 DIAGNOSIS — Z3A23 23 weeks gestation of pregnancy: Secondary | ICD-10-CM | POA: Diagnosis not present

## 2019-07-10 DIAGNOSIS — Z362 Encounter for other antenatal screening follow-up: Secondary | ICD-10-CM | POA: Diagnosis not present

## 2019-07-10 DIAGNOSIS — Z3A24 24 weeks gestation of pregnancy: Secondary | ICD-10-CM | POA: Diagnosis not present

## 2019-07-28 DIAGNOSIS — Z3689 Encounter for other specified antenatal screening: Secondary | ICD-10-CM | POA: Diagnosis not present

## 2019-07-28 DIAGNOSIS — Z23 Encounter for immunization: Secondary | ICD-10-CM | POA: Diagnosis not present

## 2019-08-25 ENCOUNTER — Other Ambulatory Visit (HOSPITAL_COMMUNITY): Payer: Self-pay | Admitting: Obstetrics and Gynecology

## 2019-08-25 ENCOUNTER — Encounter (HOSPITAL_COMMUNITY): Payer: Self-pay | Admitting: Obstetrics and Gynecology

## 2019-08-25 ENCOUNTER — Inpatient Hospital Stay (HOSPITAL_COMMUNITY)
Admission: AD | Admit: 2019-08-25 | Discharge: 2019-08-25 | Disposition: A | Discharge status: Other Acute Inpatient Hospital | Payer: BC Managed Care – PPO | Attending: Obstetrics and Gynecology | Admitting: Obstetrics and Gynecology

## 2019-08-25 ENCOUNTER — Other Ambulatory Visit: Payer: Self-pay

## 2019-08-25 ENCOUNTER — Inpatient Hospital Stay (HOSPITAL_BASED_OUTPATIENT_CLINIC_OR_DEPARTMENT_OTHER)
Admission: RE | Admit: 2019-08-25 | Discharge: 2019-08-25 | Disposition: A | Discharge status: Home / Self Care | Payer: BC Managed Care – PPO | Source: Ambulatory Visit | Attending: Obstetrics and Gynecology | Admitting: Obstetrics and Gynecology

## 2019-08-25 DIAGNOSIS — M79604 Pain in right leg: Secondary | ICD-10-CM | POA: Insufficient documentation

## 2019-08-25 DIAGNOSIS — Z3A31 31 weeks gestation of pregnancy: Secondary | ICD-10-CM | POA: Insufficient documentation

## 2019-08-25 DIAGNOSIS — O99891 Other specified diseases and conditions complicating pregnancy: Secondary | ICD-10-CM | POA: Insufficient documentation

## 2019-08-25 DIAGNOSIS — O26893 Other specified pregnancy related conditions, third trimester: Secondary | ICD-10-CM

## 2019-08-25 DIAGNOSIS — M7989 Other specified soft tissue disorders: Secondary | ICD-10-CM

## 2019-08-25 NOTE — MAU Provider Note (Addendum)
First Provider Initiated Contact with Patient 08/25/19 1631     S [name] is a 34 y.o. G2P0010 pregnant female @[redacted]w[redacted]d  who presents to MAU today with complaint of right leg pain. Pt reports she was seen in the office today and was told to come to the hospital for evaluation. The RN called the vascular center and was told that the patient had an appointment scheduled there by her OB at 4pm today, but they would still see her at this time. Pt was not meant to c ome to MAU for evaluation, but rather to present to vascular center for a DVT scan per her OB.   O BP 124/62   Pulse 81   Temp 98.5 F (36.9 C)   Resp 18   Ht 5\' 7"  (1.702 m)   Wt 97.1 kg   BMI 33.52 kg/m    Patient Vitals for the past 24 hrs:  BP Temp Pulse Resp Height Weight  08/25/19 1612 124/62 98.5 F (36.9 C) 81 18 5\' 7"  (1.702 m) 97.1 kg   Physical Exam  Constitutional: She is oriented to person, place, and time. She appears well-developed and well-nourished. No distress.  HENT:  Head: Normocephalic and atraumatic.  Respiratory: Effort normal.  Neurological: She is alert and oriented to person, place, and time.  Skin: She is not diaphoretic.  Psychiatric: She has a normal mood and affect. Her behavior is normal. Judgment and thought content normal.   A Pregnant female R/O DVT FHT 140 Medical screening exam complete  P Discharge from MAU in stable condition Pt to proceed immediately to vascular center for imaging Warning signs for worsening condition that would warrant emergency follow-up discussed Patient may return to MAU as needed for pregnancy related complaints  Dai Apel, , NP 08/25/2019 5:06 PM

## 2019-08-25 NOTE — Progress Notes (Signed)
Right lower extremity venous duplex exam completed.  Preliminary results can be found under CV proc under chart review.  08/25/2019 5:09 PM  Llewellyn Schoenberger, K., RDMS, RVT

## 2019-08-25 NOTE — MAU Note (Signed)
Pt supposed to go to out pt vascular lab for U/S. Escorted pt to vascular lab.

## 2019-08-25 NOTE — MAU Note (Signed)
Pt stated she has had varicose vein problems in her right leg even before she was pregnant . Progressively gotten worse with pregnancy. Sometimes after working pain is so bad it is hard to walk. RLL swollen and warm to touch.

## 2019-09-29 DIAGNOSIS — Z3685 Encounter for antenatal screening for Streptococcus B: Secondary | ICD-10-CM | POA: Diagnosis not present

## 2019-09-29 LAB — OB RESULTS CONSOLE GBS: GBS: NEGATIVE

## 2019-10-16 ENCOUNTER — Telehealth (HOSPITAL_COMMUNITY): Payer: Self-pay | Admitting: *Deleted

## 2019-10-16 ENCOUNTER — Encounter (HOSPITAL_COMMUNITY): Payer: Self-pay | Admitting: *Deleted

## 2019-10-16 NOTE — Telephone Encounter (Signed)
Preadmission screen  

## 2019-10-21 ENCOUNTER — Telehealth (HOSPITAL_COMMUNITY): Payer: Self-pay | Admitting: *Deleted

## 2019-10-21 ENCOUNTER — Encounter (HOSPITAL_COMMUNITY): Payer: Self-pay | Admitting: *Deleted

## 2019-10-21 NOTE — Telephone Encounter (Signed)
Preadmission screen  

## 2019-10-22 DIAGNOSIS — Z3A39 39 weeks gestation of pregnancy: Secondary | ICD-10-CM | POA: Diagnosis not present

## 2019-10-22 DIAGNOSIS — O139 Gestational [pregnancy-induced] hypertension without significant proteinuria, unspecified trimester: Secondary | ICD-10-CM | POA: Diagnosis not present

## 2019-10-23 ENCOUNTER — Other Ambulatory Visit: Payer: Self-pay

## 2019-10-23 ENCOUNTER — Other Ambulatory Visit: Payer: Self-pay | Admitting: Obstetrics and Gynecology

## 2019-10-23 NOTE — H&P (Deleted)
  The note originally documented on this encounter has been moved the the encounter in which it belongs.  
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
## 2019-10-24 ENCOUNTER — Inpatient Hospital Stay (HOSPITAL_COMMUNITY)
Admission: AD | Admit: 2019-10-24 | Discharge: 2019-10-27 | DRG: 788 | Disposition: A | Discharge status: Home / Self Care | Payer: BC Managed Care – PPO | Attending: Obstetrics and Gynecology | Admitting: Obstetrics and Gynecology

## 2019-10-24 ENCOUNTER — Inpatient Hospital Stay (HOSPITAL_COMMUNITY): Payer: BC Managed Care – PPO

## 2019-10-24 ENCOUNTER — Encounter (HOSPITAL_COMMUNITY): Payer: Self-pay | Admitting: Obstetrics and Gynecology

## 2019-10-24 DIAGNOSIS — Z3A39 39 weeks gestation of pregnancy: Secondary | ICD-10-CM | POA: Diagnosis not present

## 2019-10-24 DIAGNOSIS — Z20822 Contact with and (suspected) exposure to covid-19: Secondary | ICD-10-CM | POA: Diagnosis present

## 2019-10-24 DIAGNOSIS — Z87891 Personal history of nicotine dependence: Secondary | ICD-10-CM | POA: Diagnosis not present

## 2019-10-24 DIAGNOSIS — O134 Gestational [pregnancy-induced] hypertension without significant proteinuria, complicating childbirth: Principal | ICD-10-CM | POA: Diagnosis present

## 2019-10-24 DIAGNOSIS — O133 Gestational [pregnancy-induced] hypertension without significant proteinuria, third trimester: Secondary | ICD-10-CM | POA: Diagnosis present

## 2019-10-24 DIAGNOSIS — O365930 Maternal care for other known or suspected poor fetal growth, third trimester, not applicable or unspecified: Secondary | ICD-10-CM | POA: Diagnosis not present

## 2019-10-24 DIAGNOSIS — Z98891 History of uterine scar from previous surgery: Secondary | ICD-10-CM | POA: Diagnosis not present

## 2019-10-24 DIAGNOSIS — E669 Obesity, unspecified: Secondary | ICD-10-CM | POA: Diagnosis not present

## 2019-10-24 DIAGNOSIS — O99214 Obesity complicating childbirth: Secondary | ICD-10-CM | POA: Diagnosis present

## 2019-10-24 DIAGNOSIS — R030 Elevated blood-pressure reading, without diagnosis of hypertension: Secondary | ICD-10-CM | POA: Diagnosis not present

## 2019-10-24 LAB — COMPREHENSIVE METABOLIC PANEL
ALT: 14 U/L (ref 0–44)
AST: 18 U/L (ref 15–41)
Albumin: 2.9 g/dL — ABNORMAL LOW (ref 3.5–5.0)
Alkaline Phosphatase: 94 U/L (ref 38–126)
Anion gap: 11 (ref 5–15)
BUN: 14 mg/dL (ref 6–20)
CO2: 19 mmol/L — ABNORMAL LOW (ref 22–32)
Calcium: 9.2 mg/dL (ref 8.9–10.3)
Chloride: 107 mmol/L (ref 98–111)
Creatinine, Ser: 0.72 mg/dL (ref 0.44–1.00)
GFR calc Af Amer: >60 mL/min (ref >60)
GFR calc non Af Amer: >60 mL/min (ref >60)
Glucose, Bld: 91 mg/dL (ref 70–99)
Potassium: 3.7 mmol/L (ref 3.5–5.1)
Sodium: 137 mmol/L (ref 135–145)
Total Bilirubin: <0.1 mg/dL — ABNORMAL LOW (ref 0.3–1.2)
Total Protein: 6.4 g/dL — ABNORMAL LOW (ref 6.5–8.1)

## 2019-10-24 LAB — PROTEIN / CREATININE RATIO, URINE
Creatinine, Urine: 195 mg/dL
Protein Creatinine Ratio: 0.11 mg/mg{Cre} (ref 0.00–0.15)
Total Protein, Urine: 21 mg/dL

## 2019-10-24 LAB — TYPE AND SCREEN
ABO/RH(D): A POS
Antibody Screen: NEGATIVE

## 2019-10-24 LAB — RESPIRATORY PANEL BY RT PCR (FLU A&B, COVID)
Influenza A by PCR: NEGATIVE
Influenza B by PCR: NEGATIVE
SARS Coronavirus 2 by RT PCR: NEGATIVE

## 2019-10-24 LAB — RPR: RPR Ser Ql: NONREACTIVE

## 2019-10-24 LAB — CBC
HCT: 36.7 % (ref 36.0–46.0)
Hemoglobin: 12.1 g/dL (ref 12.0–15.0)
MCH: 31 pg (ref 26.0–34.0)
MCHC: 33 g/dL (ref 30.0–36.0)
MCV: 94.1 fL (ref 80.0–100.0)
Platelets: 236 10*3/uL (ref 150–400)
RBC: 3.9 MIL/uL (ref 3.87–5.11)
RDW: 12.8 % (ref 11.5–15.5)
WBC: 9.6 10*3/uL (ref 4.0–10.5)
nRBC: 0 % (ref 0.0–0.2)

## 2019-10-24 LAB — ABO/RH: ABO/RH(D): A POS

## 2019-10-24 MED ORDER — BUTORPHANOL TARTRATE 1 MG/ML IJ SOLN
1.0000 mg | INTRAMUSCULAR | Status: DC | PRN
  Administered 2019-10-24 – 2019-10-25 (×5): 1 mg via INTRAVENOUS
  Filled 2019-10-24 (×5): qty 1

## 2019-10-24 MED ORDER — ONDANSETRON HCL 4 MG/2ML IJ SOLN
4.0000 mg | Freq: Four times a day (QID) | INTRAMUSCULAR | Status: DC | PRN
  Administered 2019-10-25: 4 mg via INTRAVENOUS

## 2019-10-24 MED ORDER — OXYCODONE-ACETAMINOPHEN 5-325 MG PO TABS: 1.0000 | ORAL_TABLET | ORAL | Status: DC | PRN

## 2019-10-24 MED ORDER — LACTATED RINGERS IV SOLN
500.0000 mL | INTRAVENOUS | Status: DC | PRN
  Administered 2019-10-25 (×4): 500 mL via INTRAVENOUS

## 2019-10-24 MED ORDER — LACTATED RINGERS IV SOLN: INTRAVENOUS | Status: DC

## 2019-10-24 MED ORDER — OXYCODONE-ACETAMINOPHEN 5-325 MG PO TABS: 2.0000 | ORAL_TABLET | ORAL | Status: DC | PRN

## 2019-10-24 MED ORDER — SOD CITRATE-CITRIC ACID 500-334 MG/5ML PO SOLN
30.0000 mL | ORAL | Status: DC | PRN
  Administered 2019-10-25: 30 mL via ORAL
  Filled 2019-10-24: qty 30

## 2019-10-24 MED ORDER — OXYTOCIN 40 UNITS IN NORMAL SALINE INFUSION - SIMPLE MED
1.0000 m[IU]/min | INTRAVENOUS | Status: DC
  Administered 2019-10-24: 2 m[IU]/min via INTRAVENOUS
  Filled 2019-10-24: qty 1000

## 2019-10-24 MED ORDER — LIDOCAINE HCL (PF) 1 % IJ SOLN: 30.0000 mL | INTRAMUSCULAR | Status: DC | PRN

## 2019-10-24 MED ORDER — OXYTOCIN BOLUS FROM INFUSION: 500.0000 mL | Freq: Once | INTRAVENOUS | Status: DC

## 2019-10-24 MED ORDER — OXYTOCIN 40 UNITS IN NORMAL SALINE INFUSION - SIMPLE MED: 2.5000 [IU]/h | INTRAVENOUS | Status: DC

## 2019-10-24 MED ORDER — MISOPROSTOL 50MCG HALF TABLET
50.0000 ug | ORAL_TABLET | Freq: Once | ORAL | Status: AC
  Administered 2019-10-24: 50 ug via ORAL
  Filled 2019-10-24: qty 1

## 2019-10-24 MED ORDER — TERBUTALINE SULFATE 1 MG/ML IJ SOLN: 0.2500 mg | Freq: Once | INTRAMUSCULAR | Status: DC | PRN

## 2019-10-24 MED ORDER — ACETAMINOPHEN 325 MG PO TABS: 650.0000 mg | ORAL_TABLET | ORAL | Status: DC | PRN

## 2019-10-24 MED ORDER — MISOPROSTOL 25 MCG QUARTER TABLET
25.0000 ug | ORAL_TABLET | ORAL | Status: DC | PRN
  Administered 2019-10-24 (×2): 25 ug via VAGINAL
  Filled 2019-10-24 (×2): qty 1

## 2019-10-24 NOTE — Progress Notes (Signed)
Patient ID: Kaylee Brown, female   DOB: 1985-11-01, 34 y.o.   MRN: [id number] Pt feeling cramping  FHR category 1  Cervix 1/40/-3  Foley bulb placed without difficulty  Pitocin at 10 mu and will continue

## 2019-10-24 NOTE — Progress Notes (Signed)
2nd dose of vaginal cytotec placed VE-cl/th/-4

## 2019-10-24 NOTE — Progress Notes (Signed)
Patient ID: Kaylee Brown, female   DOB: 21-Dec-1985, 34 y.o.   MRN: [id number] Pt with increasing discomfort with foley bulb in, would like more stadol  afeb VSS FHR category 1  Gentle pull on foley and no movement Pitocin at 10 mu Continue to follow

## 2019-10-24 NOTE — MAU Note (Signed)
Covid swab obtained without difficulty and pt tol well. No symptoms 

## 2019-10-24 NOTE — Progress Notes (Signed)
Patient ID: Kaylee Brown, female   DOB: 1986-04-01, 34 y.o.   MRN: [id number] Pt received 2nd dose of cytotec two hours ago and feeling minimal cramping  afeb  BP has been WNL since admission and all PIH labs WNL  Cervical exam deferred, will check when time for next dose to determine treatment plan  FHR category 1--baseline 130

## 2019-10-24 NOTE — Progress Notes (Signed)
Patient ID: Kaylee Brown, female   DOB: 1985/11/29, 34 y.o.   MRN: [id number] Late entry note  Pt feeling mild cramping after oral cytotec  FHR category 1 Cervix long/0.5/-3  Attempted foley bulb placement again and still unable to pass Pitocin started at 230pm and will reattempt foley later this afternoon

## 2019-10-24 NOTE — Progress Notes (Signed)
Foley bulb attempted by Dr Senaida Ores Attempt unsuccessful

## 2019-10-24 NOTE — Progress Notes (Signed)
Patient ID: Kaylee Brown, female   DOB: [birth year], 34 y.o.   MRN: [id number] Pt feeling some cramping  afebn VSS FHR category 1  Cervix long/0/5/posterior/firm Attempted to place foley bulb but would not thread through Will give cytotec po x another dose when due then try again later

## 2019-10-25 ENCOUNTER — Inpatient Hospital Stay (HOSPITAL_COMMUNITY): Payer: BC Managed Care – PPO | Admitting: Anesthesiology

## 2019-10-25 ENCOUNTER — Encounter (HOSPITAL_COMMUNITY)
Admission: AD | Disposition: A | Discharge status: Home / Self Care | Payer: Self-pay | Source: Home / Self Care | Attending: Obstetrics and Gynecology

## 2019-10-25 ENCOUNTER — Encounter (HOSPITAL_COMMUNITY): Payer: Self-pay | Admitting: Obstetrics and Gynecology

## 2019-10-25 DIAGNOSIS — Z3A39 39 weeks gestation of pregnancy: Secondary | ICD-10-CM | POA: Diagnosis not present

## 2019-10-25 DIAGNOSIS — O134 Gestational [pregnancy-induced] hypertension without significant proteinuria, complicating childbirth: Secondary | ICD-10-CM | POA: Diagnosis not present

## 2019-10-25 DIAGNOSIS — R030 Elevated blood-pressure reading, without diagnosis of hypertension: Secondary | ICD-10-CM | POA: Diagnosis not present

## 2019-10-25 LAB — CBC WITH DIFFERENTIAL/PLATELET
Abs Immature Granulocytes: 0.04 10*3/uL (ref 0.00–0.07)
Basophils Absolute: 0 10*3/uL (ref 0.0–0.1)
Basophils Relative: 0 %
Eosinophils Absolute: 0.1 10*3/uL (ref 0.0–0.5)
Eosinophils Relative: 1 %
HCT: 35.9 % — ABNORMAL LOW (ref 36.0–46.0)
Hemoglobin: 11.9 g/dL — ABNORMAL LOW (ref 12.0–15.0)
Immature Granulocytes: 0 %
Lymphocytes Relative: 13 %
Lymphs Abs: 1.4 10*3/uL (ref 0.7–4.0)
MCH: 31.6 pg (ref 26.0–34.0)
MCHC: 33.1 g/dL (ref 30.0–36.0)
MCV: 95.5 fL (ref 80.0–100.0)
Monocytes Absolute: 0.9 10*3/uL (ref 0.1–1.0)
Monocytes Relative: 8 %
Neutro Abs: 8.5 10*3/uL — ABNORMAL HIGH (ref 1.7–7.7)
Neutrophils Relative %: 78 %
Platelets: 236 10*3/uL (ref 150–400)
RBC: 3.76 MIL/uL — ABNORMAL LOW (ref 3.87–5.11)
RDW: 12.8 % (ref 11.5–15.5)
WBC: 11 10*3/uL — ABNORMAL HIGH (ref 4.0–10.5)
nRBC: 0 % (ref 0.0–0.2)

## 2019-10-25 SURGERY — Surgical Case
Anesthesia: Epidural | Site: Abdomen | Wound class: Clean Contaminated

## 2019-10-25 MED ORDER — DEXAMETHASONE SODIUM PHOSPHATE 10 MG/ML IJ SOLN
INTRAMUSCULAR | Status: DC | PRN
  Administered 2019-10-25: 10 mg via INTRAVENOUS

## 2019-10-25 MED ORDER — NALBUPHINE HCL 10 MG/ML IJ SOLN: 5.0000 mg | INTRAMUSCULAR | Status: DC | PRN

## 2019-10-25 MED ORDER — OXYTOCIN 40 UNITS IN NORMAL SALINE INFUSION - SIMPLE MED
INTRAVENOUS | Status: AC
  Filled 2019-10-25: qty 1000

## 2019-10-25 MED ORDER — NALOXONE HCL 0.4 MG/ML IJ SOLN: 0.4000 mg | INTRAMUSCULAR | Status: DC | PRN

## 2019-10-25 MED ORDER — OXYCODONE HCL 5 MG PO TABS: 5.0000 mg | ORAL_TABLET | Freq: Once | ORAL | Status: DC | PRN

## 2019-10-25 MED ORDER — KETOROLAC TROMETHAMINE 30 MG/ML IJ SOLN: 30.0000 mg | Freq: Once | INTRAMUSCULAR | Status: AC | PRN

## 2019-10-25 MED ORDER — NALBUPHINE HCL 10 MG/ML IJ SOLN: 5.0000 mg | Freq: Once | INTRAMUSCULAR | Status: DC | PRN

## 2019-10-25 MED ORDER — FENTANYL CITRATE (PF) 100 MCG/2ML IJ SOLN
INTRAMUSCULAR | Status: AC
  Filled 2019-10-25: qty 2

## 2019-10-25 MED ORDER — METHYLERGONOVINE MALEATE 0.2 MG/ML IJ SOLN
INTRAMUSCULAR | Status: AC
  Filled 2019-10-25: qty 1

## 2019-10-25 MED ORDER — LACTATED RINGERS AMNIOINFUSION: INTRAVENOUS | Status: DC

## 2019-10-25 MED ORDER — ONDANSETRON HCL 4 MG/2ML IJ SOLN: 4.0000 mg | Freq: Three times a day (TID) | INTRAMUSCULAR | Status: DC | PRN

## 2019-10-25 MED ORDER — SODIUM CHLORIDE 0.9 % IV SOLN: INTRAVENOUS | Status: DC | PRN

## 2019-10-25 MED ORDER — SODIUM CHLORIDE (PF) 0.9 % IJ SOLN
INTRAMUSCULAR | Status: DC | PRN
  Administered 2019-10-25: 12 mL/h via EPIDURAL

## 2019-10-25 MED ORDER — ONDANSETRON HCL 4 MG/2ML IJ SOLN: 4.0000 mg | Freq: Once | INTRAMUSCULAR | Status: DC | PRN

## 2019-10-25 MED ORDER — LIDOCAINE HCL (PF) 1 % IJ SOLN
INTRAMUSCULAR | Status: DC | PRN
  Administered 2019-10-25 (×2): 4 mL via EPIDURAL

## 2019-10-25 MED ORDER — LIDOCAINE-EPINEPHRINE (PF) 2 %-1:200000 IJ SOLN
INTRAMUSCULAR | Status: DC | PRN
  Administered 2019-10-25: 2 mL via EPIDURAL
  Administered 2019-10-25: 3 mL via EPIDURAL
  Administered 2019-10-25: 2 mL via EPIDURAL
  Administered 2019-10-25: 5 mL via EPIDURAL

## 2019-10-25 MED ORDER — MORPHINE SULFATE (PF) 0.5 MG/ML IJ SOLN
INTRAMUSCULAR | Status: AC
  Filled 2019-10-25: qty 10

## 2019-10-25 MED ORDER — OXYTOCIN 10 UNIT/ML IJ SOLN
INTRAMUSCULAR | Status: DC | PRN
  Administered 2019-10-25: 40 [IU]

## 2019-10-25 MED ORDER — CEFAZOLIN SODIUM-DEXTROSE 2-3 GM-%(50ML) IV SOLR
INTRAVENOUS | Status: DC | PRN
  Administered 2019-10-25: 2 g via INTRAVENOUS

## 2019-10-25 MED ORDER — HYDROMORPHONE HCL 1 MG/ML IJ SOLN
0.2500 mg | INTRAMUSCULAR | Status: DC | PRN
  Administered 2019-10-26: 0.5 mg via INTRAVENOUS

## 2019-10-25 MED ORDER — DIPHENHYDRAMINE HCL 25 MG PO CAPS: 25.0000 mg | ORAL_CAPSULE | ORAL | Status: DC | PRN

## 2019-10-25 MED ORDER — PHENYLEPHRINE HCL (PRESSORS) 10 MG/ML IV SOLN
INTRAVENOUS | Status: DC | PRN
  Administered 2019-10-25: 80 ug via INTRAVENOUS

## 2019-10-25 MED ORDER — MORPHINE SULFATE (PF) 0.5 MG/ML IJ SOLN
INTRAMUSCULAR | Status: DC | PRN
  Administered 2019-10-25: 3 mg via EPIDURAL

## 2019-10-25 MED ORDER — SCOPOLAMINE 1 MG/3DAYS TD PT72
1.0000 | MEDICATED_PATCH | Freq: Once | TRANSDERMAL | Status: DC
  Administered 2019-10-26: 1.5 mg via TRANSDERMAL

## 2019-10-25 MED ORDER — NALOXONE HCL 4 MG/10ML IJ SOLN
1.0000 ug/kg/h | INTRAVENOUS | Status: DC | PRN
  Filled 2019-10-25: qty 5

## 2019-10-25 MED ORDER — METHYLERGONOVINE MALEATE 0.2 MG/ML IJ SOLN
INTRAMUSCULAR | Status: DC | PRN
  Administered 2019-10-25: .2 mg via INTRAMUSCULAR

## 2019-10-25 MED ORDER — ONDANSETRON HCL 4 MG/2ML IJ SOLN
INTRAMUSCULAR | Status: AC
  Filled 2019-10-25: qty 2

## 2019-10-25 MED ORDER — OXYCODONE HCL 5 MG/5ML PO SOLN: 5.0000 mg | Freq: Once | ORAL | Status: DC | PRN

## 2019-10-25 MED ORDER — PHENYLEPHRINE 40 MCG/ML (10ML) SYRINGE FOR IV PUSH (FOR BLOOD PRESSURE SUPPORT)
PREFILLED_SYRINGE | INTRAVENOUS | Status: AC
  Filled 2019-10-25: qty 10

## 2019-10-25 MED ORDER — NALBUPHINE HCL 10 MG/ML IJ SOLN
5.0000 mg | INTRAMUSCULAR | Status: DC | PRN
  Administered 2019-10-26: 5 mg via INTRAVENOUS
  Filled 2019-10-25: qty 1

## 2019-10-25 MED ORDER — DIPHENHYDRAMINE HCL 50 MG/ML IJ SOLN: 12.5000 mg | INTRAMUSCULAR | Status: DC | PRN

## 2019-10-25 MED ORDER — DEXAMETHASONE SODIUM PHOSPHATE 10 MG/ML IJ SOLN
INTRAMUSCULAR | Status: AC
  Filled 2019-10-25: qty 1

## 2019-10-25 MED ORDER — FENTANYL CITRATE (PF) 100 MCG/2ML IJ SOLN
INTRAMUSCULAR | Status: DC | PRN
  Administered 2019-10-25: 100 ug via EPIDURAL

## 2019-10-25 MED ORDER — CEFAZOLIN SODIUM-DEXTROSE 2-4 GM/100ML-% IV SOLN
INTRAVENOUS | Status: AC
  Filled 2019-10-25: qty 100

## 2019-10-25 MED ORDER — LIDOCAINE-EPINEPHRINE (PF) 2 %-1:200000 IJ SOLN
INTRAMUSCULAR | Status: AC
  Filled 2019-10-25: qty 10

## 2019-10-25 MED ORDER — FENTANYL-BUPIVACAINE-NACL 0.5-0.125-0.9 MG/250ML-% EP SOLN
EPIDURAL | Status: AC
  Filled 2019-10-25: qty 250

## 2019-10-25 MED ORDER — SODIUM CHLORIDE 0.9% FLUSH: 3.0000 mL | INTRAVENOUS | Status: DC | PRN

## 2019-10-25 SURGICAL SUPPLY — 35 items
APL SKNCLS STERI-STRIP NONHPOA (GAUZE/BANDAGES/DRESSINGS) ×1
BENZOIN TINCTURE PRP APPL 2/3 (GAUZE/BANDAGES/DRESSINGS) ×1 IMPLANT
CHLORAPREP W/TINT 26ML (MISCELLANEOUS) ×2 IMPLANT
CLAMP CORD UMBIL (MISCELLANEOUS) IMPLANT
CLOTH BEACON ORANGE TIMEOUT ST (SAFETY) ×2 IMPLANT
DRSG OPSITE POSTOP 4X10 (GAUZE/BANDAGES/DRESSINGS) ×2 IMPLANT
ELECT REM PT RETURN 9FT ADLT (ELECTROSURGICAL) ×2
ELECTRODE REM PT RTRN 9FT ADLT (ELECTROSURGICAL) ×1 IMPLANT
EXTRACTOR VACUUM KIWI (MISCELLANEOUS) IMPLANT
GLOVE BIO SURGEON STRL SZ 6.5 (GLOVE) ×2 IMPLANT
GLOVE BIOGEL PI IND STRL 7.0 (GLOVE) ×1 IMPLANT
GLOVE BIOGEL PI INDICATOR 7.0 (GLOVE) ×1
GOWN STRL REUS W/TWL LRG LVL3 (GOWN DISPOSABLE) ×4 IMPLANT
KIT ABG SYR 3ML LUER SLIP (SYRINGE) IMPLANT
NDL HYPO 25X5/8 SAFETYGLIDE (NEEDLE) IMPLANT
NEEDLE HYPO 25X5/8 SAFETYGLIDE (NEEDLE) IMPLANT
NS IRRIG 1000ML POUR BTL (IV SOLUTION) ×2 IMPLANT
PACK C SECTION WH (CUSTOM PROCEDURE TRAY) ×2 IMPLANT
PAD OB MATERNITY 4.3X12.25 (PERSONAL CARE ITEMS) ×2 IMPLANT
PENCIL SMOKE EVAC W/HOLSTER (ELECTROSURGICAL) ×2 IMPLANT
RTRCTR C-SECT PINK 25CM LRG (MISCELLANEOUS) ×2 IMPLANT
STRIP CLOSURE SKIN 1/2X4 (GAUZE/BANDAGES/DRESSINGS) ×1 IMPLANT
SUT CHROMIC 1 CTX 36 (SUTURE) ×4 IMPLANT
SUT PLAIN 0 NONE (SUTURE) IMPLANT
SUT PLAIN 2 0 XLH (SUTURE) ×2 IMPLANT
SUT VIC AB 0 CT1 27 (SUTURE) ×4
SUT VIC AB 0 CT1 27XBRD ANBCTR (SUTURE) ×2 IMPLANT
SUT VIC AB 2-0 CT1 27 (SUTURE) ×2
SUT VIC AB 2-0 CT1 TAPERPNT 27 (SUTURE) ×1 IMPLANT
SUT VIC AB 3-0 CT1 27 (SUTURE)
SUT VIC AB 3-0 CT1 TAPERPNT 27 (SUTURE) IMPLANT
SUT VIC AB 4-0 KS 27 (SUTURE) ×2 IMPLANT
TOWEL OR 17X24 6PK STRL BLUE (TOWEL DISPOSABLE) ×2 IMPLANT
TRAY FOLEY W/BAG SLVR 14FR LF (SET/KITS/TRAYS/PACK) ×2 IMPLANT
WATER STERILE IRR 1000ML POUR (IV SOLUTION) ×2 IMPLANT

## 2019-10-25 NOTE — Transfer of Care (Signed)
Immediate Anesthesia Transfer of Care Note  Patient: Kaylee Brown  Procedure(s) Performed: CESAREAN SECTION (N/A Abdomen)  Patient Location: PACU  Anesthesia Type:Epidural  Level of Consciousness: awake and alert   Airway & Oxygen Therapy: Patient Spontanous Breathing  Post-op Assessment: Report given to RN and Post -op Vital signs reviewed and stable  Post vital signs: Reviewed and stable  Last Vitals:  Vitals Value Taken Time  BP 140/101 10/25/19 2351  Temp    Pulse 66 10/25/19 2354  Resp 15 10/25/19 2354  SpO2 99 % 10/25/19 2354  Vitals shown include unvalidated device data.  Last Pain:  Vitals:   10/25/19 2200  TempSrc:   PainSc: 0-No pain         Complications: No apparent anesthesia complications

## 2019-10-25 NOTE — Anesthesia Procedure Notes (Signed)
Epidural Patient location during procedure: OB Start time: 10/25/2019 2:28 PM End time: 10/25/2019 2:36 PM  Staffing Anesthesiologist: Mal Amabile, MD Performed: anesthesiologist   Preanesthetic Checklist Completed: patient identified, IV checked, site marked, risks and benefits discussed, surgical consent, monitors and equipment checked, pre-op evaluation and timeout performed  Epidural Patient position: sitting Prep: DuraPrep and site prepped and draped Patient monitoring: continuous pulse ox and blood pressure Approach: midline Location: L3-L4 Injection technique: LOR air  Needle:  Needle type: Tuohy  Needle gauge: 17 G Needle length: 9 cm and 9 Needle insertion depth: 5 cm cm Catheter type: closed end flexible Catheter size: 19 Gauge Catheter at skin depth: 10 cm Test dose: negative and Other  Assessment Events: blood not aspirated, injection not painful, no injection resistance, no paresthesia and negative IV test  Additional Notes Patient identified. Risks and benefits discussed including failed block, incomplete  Pain control, post dural puncture headache, nerve damage, paralysis, blood pressure Changes, nausea, vomiting, reactions to medications-both toxic and allergic and post Partum back pain. All questions were answered. Patient expressed understanding and wished to proceed. Sterile technique was used throughout procedure. Epidural site was Dressed with sterile barrier dressing. No paresthesias, signs of intravascular injection Or signs of intrathecal spread were encountered.  Patient was more comfortable after the epidural was dosed. Please see RN's note for documentation of vital signs and FHR which are stable. Reason for block:procedure for pain

## 2019-10-25 NOTE — Progress Notes (Signed)
Patient ID: Kaylee Brown, female   DOB: 09-25-1985, 34 y.o.   MRN: [id number] Pt overall comfortable with epidural  FHR category 1 with occasional early decelerations Adequate MVU's over last 2 hours with pitocin at 24 mu  80/3/-1  D/w pt protracted latent phase labor and that we will try to give things time to progress.  Continue pitocin and recheck in a few hours

## 2019-10-25 NOTE — Progress Notes (Signed)
Patient ID: Kaylee Brown, female   DOB: 02/16/1986, 34 y.o.   MRN: [id number] Pt uncomfortable and asking for more stadol  FHR category 1  Exam with cervix 70/2-3/-2 and foley pushing down partly through So small amount deflated and was able to pull through Vertex well-applied so AROM performed with clear fluid  Will increase pitocin as needed, epidural when desires

## 2019-10-25 NOTE — Progress Notes (Signed)
Patient ID: Kaylee Brown, female   DOB: Dec 19, 1985, 34 y.o.   MRN: [id number] Still just feeling mild/mod cramps  afeb VSS FHR Category 1  Cervix 70/2+/-2  Will hold pitocin x 15 minutes and then restart at 60mu increasing by 34mu to try to gain adequate contractions Latent phase labor

## 2019-10-25 NOTE — Anesthesia Preprocedure Evaluation (Addendum)
Anesthesia Evaluation  Patient identified by MRN, date of birth, ID band Patient awake    Reviewed: Allergy & Precautions, Patient's Chart, lab work & pertinent test results  Airway Mallampati: II  TM Distance: >3 FB Neck ROM: Full    Dental no notable dental hx. (+) Teeth Intact   Pulmonary former smoker,    Pulmonary exam normal breath sounds clear to auscultation       Cardiovascular hypertension, Normal cardiovascular exam Rhythm:Regular Rate:Normal  Gestational on no Rx   Neuro/Psych  Headaches,  Neuromuscular disease negative psych ROS   GI/Hepatic Neg liver ROS, GERD  ,  Endo/Other  Obesity  Renal/GU negative Renal ROS  negative genitourinary   Musculoskeletal Cervical strain   Abdominal (+) + obese,   Peds  Hematology negative hematology ROS (+) anemia ,   Anesthesia Other Findings   Reproductive/Obstetrics (+) Pregnancy                             Anesthesia Physical Anesthesia Plan  ASA: II  Anesthesia Plan: Epidural   Post-op Pain Management:    Induction:   PONV Risk Score and Plan:   Airway Management Planned: Natural Airway  Additional Equipment:   Intra-op Plan:   Post-operative Plan:   Informed Consent: I have reviewed the patients History and Physical, chart, labs and discussed the procedure including the risks, benefits and alternatives for the proposed anesthesia with the patient or authorized representative who has indicated his/her understanding and acceptance.       Plan Discussed with: Anesthesiologist  Anesthesia Plan Comments:         Anesthesia Quick Evaluation

## 2019-10-25 NOTE — Progress Notes (Signed)
Patient ID: Quillian Quince, female   DOB: Dec 16, 1985, 34 y.o.   MRN: [id number] CTSP when her IUPC fell out with moving and began to have deep variable decelerations.    Pt examined and still 80/3+/-1 IUPC replaced and FSE placed   FHR noted to have good variability and but deep variable decelerations with each contraction.  Multiple interventions tried over 30 minutes-- position changes, amnioinfusion.  Deep variables were not resolved and persisted into the 50-60 range in multiple positions, and pitocin turned off.  Given her protracted labor course and now category 2 tracing not responsive to interventions, I d/w pt and husband I feel best to proceed with c-section.  We reviewed the process including risks and benefits including bleeding, infection and possible damage to bowel and bladder.  Pt agreeable to proceed.  OR notified and preparing.  Will proceed and follow FHR closely until go back.  Variables persist but less severe with pitocin off.

## 2019-10-26 ENCOUNTER — Encounter (HOSPITAL_COMMUNITY): Payer: Self-pay | Admitting: Obstetrics and Gynecology

## 2019-10-26 DIAGNOSIS — O133 Gestational [pregnancy-induced] hypertension without significant proteinuria, third trimester: Secondary | ICD-10-CM | POA: Diagnosis not present

## 2019-10-26 DIAGNOSIS — R030 Elevated blood-pressure reading, without diagnosis of hypertension: Secondary | ICD-10-CM | POA: Diagnosis not present

## 2019-10-26 DIAGNOSIS — Z98891 History of uterine scar from previous surgery: Secondary | ICD-10-CM

## 2019-10-26 DIAGNOSIS — O134 Gestational [pregnancy-induced] hypertension without significant proteinuria, complicating childbirth: Secondary | ICD-10-CM | POA: Diagnosis not present

## 2019-10-26 LAB — CBC
HCT: 38.4 % (ref 36.0–46.0)
Hemoglobin: 12.7 g/dL (ref 12.0–15.0)
MCH: 31.6 pg (ref 26.0–34.0)
MCHC: 33.1 g/dL (ref 30.0–36.0)
MCV: 95.5 fL (ref 80.0–100.0)
Platelets: 222 10*3/uL (ref 150–400)
RBC: 4.02 MIL/uL (ref 3.87–5.11)
RDW: 13 % (ref 11.5–15.5)
WBC: 19.4 10*3/uL — ABNORMAL HIGH (ref 4.0–10.5)
nRBC: 0 % (ref 0.0–0.2)

## 2019-10-26 MED ORDER — OXYCODONE HCL 5 MG PO TABS
5.0000 mg | ORAL_TABLET | ORAL | Status: DC | PRN
  Administered 2019-10-27: 5 mg via ORAL
  Filled 2019-10-26: qty 1

## 2019-10-26 MED ORDER — LACTATED RINGERS IV SOLN: INTRAVENOUS | Status: DC

## 2019-10-26 MED ORDER — DIBUCAINE (PERIANAL) 1 % EX OINT: 1.0000 "application " | TOPICAL_OINTMENT | CUTANEOUS | Status: DC | PRN

## 2019-10-26 MED ORDER — PRENATAL MULTIVITAMIN CH
1.0000 | ORAL_TABLET | Freq: Every day | ORAL | Status: DC
  Administered 2019-10-26 – 2019-10-27 (×2): 1 via ORAL
  Filled 2019-10-26 (×2): qty 1

## 2019-10-26 MED ORDER — SIMETHICONE 80 MG PO CHEW: 80.0000 mg | CHEWABLE_TABLET | ORAL | Status: DC | PRN

## 2019-10-26 MED ORDER — DIPHENHYDRAMINE HCL 25 MG PO CAPS
25.0000 mg | ORAL_CAPSULE | Freq: Four times a day (QID) | ORAL | Status: DC | PRN
  Administered 2019-10-26 – 2019-10-27 (×3): 25 mg via ORAL
  Filled 2019-10-26 (×3): qty 1

## 2019-10-26 MED ORDER — TETANUS-DIPHTH-ACELL PERTUSSIS 5-2.5-18.5 LF-MCG/0.5 IM SUSP: 0.5000 mL | Freq: Once | INTRAMUSCULAR | Status: DC

## 2019-10-26 MED ORDER — MENTHOL 3 MG MT LOZG: 1.0000 | LOZENGE | OROMUCOSAL | Status: DC | PRN

## 2019-10-26 MED ORDER — OXYTOCIN 40 UNITS IN NORMAL SALINE INFUSION - SIMPLE MED
2.5000 [IU]/h | INTRAVENOUS | Status: AC
  Administered 2019-10-26: 2.5 [IU]/h via INTRAVENOUS
  Filled 2019-10-26: qty 1000

## 2019-10-26 MED ORDER — COCONUT OIL OIL: 1.0000 "application " | TOPICAL_OIL | Status: DC | PRN

## 2019-10-26 MED ORDER — ACETAMINOPHEN 325 MG PO TABS
650.0000 mg | ORAL_TABLET | ORAL | Status: DC | PRN
  Administered 2019-10-26 – 2019-10-27 (×3): 650 mg via ORAL
  Filled 2019-10-26 (×4): qty 2

## 2019-10-26 MED ORDER — ZOLPIDEM TARTRATE 5 MG PO TABS: 5.0000 mg | ORAL_TABLET | Freq: Every evening | ORAL | Status: DC | PRN

## 2019-10-26 MED ORDER — SENNOSIDES-DOCUSATE SODIUM 8.6-50 MG PO TABS
2.0000 | ORAL_TABLET | ORAL | Status: DC
  Administered 2019-10-26: 2 via ORAL
  Filled 2019-10-26: qty 2

## 2019-10-26 MED ORDER — HYDROMORPHONE HCL 1 MG/ML IJ SOLN
INTRAMUSCULAR | Status: AC
  Filled 2019-10-26: qty 0.5

## 2019-10-26 MED ORDER — WITCH HAZEL-GLYCERIN EX PADS: 1.0000 "application " | MEDICATED_PAD | CUTANEOUS | Status: DC | PRN

## 2019-10-26 MED ORDER — KETOROLAC TROMETHAMINE 30 MG/ML IJ SOLN
INTRAMUSCULAR | Status: AC
  Filled 2019-10-26: qty 1

## 2019-10-26 MED ORDER — LACTATED RINGERS IV BOLUS
500.0000 mL | Freq: Once | INTRAVENOUS | Status: AC
  Administered 2019-10-26: 500 mL via INTRAVENOUS

## 2019-10-26 MED ORDER — SIMETHICONE 80 MG PO CHEW
80.0000 mg | CHEWABLE_TABLET | ORAL | Status: DC
  Administered 2019-10-26: 80 mg via ORAL
  Filled 2019-10-26: qty 1

## 2019-10-26 MED ORDER — SCOPOLAMINE 1 MG/3DAYS TD PT72
MEDICATED_PATCH | TRANSDERMAL | Status: AC
  Filled 2019-10-26: qty 1

## 2019-10-26 MED ORDER — SIMETHICONE 80 MG PO CHEW
80.0000 mg | CHEWABLE_TABLET | Freq: Three times a day (TID) | ORAL | Status: DC
  Administered 2019-10-26 – 2019-10-27 (×4): 80 mg via ORAL
  Filled 2019-10-26 (×4): qty 1

## 2019-10-26 MED ORDER — IBUPROFEN 800 MG PO TABS
800.0000 mg | ORAL_TABLET | Freq: Three times a day (TID) | ORAL | Status: DC
  Administered 2019-10-26 – 2019-10-27 (×4): 800 mg via ORAL
  Filled 2019-10-26 (×4): qty 1

## 2019-10-26 NOTE — Progress Notes (Signed)
RN made attempts during the day to have patient walk. Refusal from patient , one time she was resting, next time she was breastfeeding, two more times when patient was asleep. Encouraged movement for patient to feel better sooner from surgery.

## 2019-10-26 NOTE — Lactation Note (Addendum)
This note was copied from a baby's chart. Lactation Consultation Note  Patient Name: Kaylee Brown Date: 10/26/2019  Mom trying to breastfeed on arrival.  Infant on nipple tip and moms nipple compressed. Even when get infant on deeper she tends to go back to pursing and want to get on tip. Infants tongue is all over the place.  She pushes nipple out. Infant fussy and comes off and on the breast.   After many attempts, used 20 mm nipple shield to assist with latch. Infant maintained better. A few swallows heard.  But still comes off and on some. Mom reported comfort. Urged mom to use the nipple shield until infant breastfeeding better.   Urged mom to feed the baby 8 or more times day and on cue.  It has been 6 hours per mom and RN since the last breastfeed.  Mom had benadryl and had fallen asleep. Mom induced, csection, infant less than 6 pounds.  Currently 17 hours old.  Urged massage,hand expression and pumping.  Feed back all expressed mothers milk.  Mom reports really doesnt want to pump because didn't get anything.  Reviewed pumping as more for stimulatioin that if we get anything think of it as a bonus. Discussed with RN use of donor milk with mom again because infant hungry and very fussy at breast. RN reports she talked with mom and she did not want to do that at this time.  Mom reports she is scared infant will like donor milk more than hers.  Urged mom to call lactation as needed  Maternal Data    Feeding    LATCH Score                   Interventions    Lactation Tools Discussed/Used     Consult Status      Neomia Dear 10/26/2019, 5:33 PM

## 2019-10-26 NOTE — Op Note (Addendum)
Operative Note    Preoperative Diagnosis Term pregnancy at 39 6/7 weeks Protracted labor course Category 2 tracing remote from delivery/repetitive deep variable decelerations  Postoperative Diagnosis Same with nuchal cord x 1  Small for gestational age   Procedure Primary low transverse c-section with 2 layer closure of uterus  Surgeon Huel Cote, MD  Anesthesia Epidural  Fluids: EBL UOP IVF1800 ml LR  Findings Viable female infant in the vertex presentation.  Apgars 8,9 weight 5 lb 12 oz.  Nuchal cord x 1.  Cord thin.  Normal ovaries, uterus and tubes.  Placenta normal just appeared mature and slightly adherent  Specimen Placenta to L&D   Procedure Note Patient was taken to the operating room where epidural anesthesia was boosted  found to be adequate by Allis clamp test. She was prepped and draped in the normal sterile fashion in the dorsal supine position with a leftward tilt. An appropriate time out was performed. A Pfannenstiel skin incision was then made with the scalpel and carried through to the underlying layer of fascia by sharp dissection and Bovie cautery. The fascia was nicked in the midline and the incision was extended laterally with Mayo scissors. The inferior aspect of the incision was grasped Coker clamps and dissected off the underlying rectus muscles. In a similar fashion the superior aspect was dissected off the rectus muscles. Rectus muscles were separated in the midline and the peritoneal cavity entered bluntly. The peritoneal incision was then extended both superiorly and inferiorly with careful attention to avoid both bowel and bladder. The Alexis self-retaining wound retractor was then placed within the incision and the lower uterine segment exposed. The bladder flap was developed with Metzenbaum scissors and pushed away from the lower uterine segment. The lower uterine segment was then incised in a transverse fashion and the cavity itself  entered bluntly. The incision was extended bluntly. The infant's head was then lifted and delivered from the incision without difficulty. The remainder of the infant delivered and the nose and mouth bulb suctioned with the cord clamped and cut as well. The infant was handed off to the waiting pediatricians. The placenta was then spontaneously expressed from the uterus, noted to be slightly adherent.  Extra care was taken to clean the uterus with lap sponges and clear it of all clots and debris with no placental tissue noted.  The uterine incision was then repaired in 2 layers the first layer was a running locked layer 1-0 chromic and the second an imbricating layer of the same suture. The tubes and ovaries were inspected and the gutters cleared of all clots and debris. The uterine incision was inspected and found to be hemostatic. All instruments and sponges as well as the Alexis retractor were then removed from the abdomen. The rectus muscles and peritoneum were then reapproximated with several interrupted mattress sutures of 2-0 Vicryl. The fascia was then closed with 0 Vicryl in a running fashion. Subcutaneous tissue was reapproximated with 3-0 plain in a running fashion. The skin was closed with a subcuticular stitch of 4-0 Vicryl on a Keith needle and then reinforced with benzoin and Steri-Strips. At the conclusion of the procedure all instruments and sponge counts were correct. Patient was taken to the recovery room in good condition with her baby accompanying her skin to skin.

## 2019-10-26 NOTE — Progress Notes (Signed)
Subjective: Postpartum Day 1: Cesarean Delivery Patient reports tolerating PO.  Her pain is well-controlled  Objective: Vital signs in last 24 hours: Temp:  [97.7 F (36.5 C)-98.8 F (37.1 C)] 98.6 F (37 C) (04/18 1006) Pulse Rate:  [53-85] 60 (04/18 1006) Resp:  [14-23] 18 (04/18 1006) BP: (96-140)/(45-101) 113/82 (04/18 1006) SpO2:  [95 %-100 %] 97 % (04/18 1006)  Physical Exam:  General: alert and cooperative Lochia: appropriate Uterine Fundus: firm Incision: C/D/I   Recent Labs    10/25/19 0454 10/26/19 0418  HGB 11.9* 12.7  HCT 35.9* 38.4    Assessment/Plan: Status post Cesarean section. Doing well postoperatively. Urine still seems concentrated so will give a bolus x 1 before pulling foley later this PM. Continue current care.  Oliver Pila 10/26/2019, 10:40 AM

## 2019-10-26 NOTE — Lactation Note (Addendum)
This note was copied from a baby's chart. Lactation Consultation Note  Patient Name: Girl Amaliya Whitelaw SHUOH'F Date: 10/26/2019 Reason for consult: Initial assessment;1st time breastfeeding P1, 3 hour term female infant. Per mom, infant breastfed for 40 minutes in L&D. Infant is currently in [address] due to low temperatures.  While in Nurse infant is being supplemented with donor breast milk. Mom is tired and wants LC services during the day.  Maternal Data Formula Feeding for Exclusion: No Does the patient have breastfeeding experience prior to this delivery?: No  Feeding Feeding Type: Breast Fed  LATCH Score Latch: Repeated attempts needed to sustain latch, nipple held in mouth throughout feeding, stimulation needed to elicit sucking reflex.  Audible Swallowing: Spontaneous and intermittent  Type of Nipple: Everted at rest and after stimulation  Comfort (Breast/Nipple): Soft / non-tender  Hold (Positioning): Full assist, staff holds infant at breast  LATCH Score: 7  Interventions Interventions: Breast feeding basics reviewed;DEBP  Lactation Tools Discussed/Used WIC Program: No   Consult Status Consult Status: Follow-up Date: 10/26/19 Follow-up type: In-patient    Danelle Earthly 10/26/2019, 2:46 AM

## 2019-10-26 NOTE — Progress Notes (Signed)
Patient Rubella Non-Immune. Offered pt MMR vaccine. Patient declined.   Erlene Quan 3:49 AM 10/26/2019

## 2019-10-27 ENCOUNTER — Encounter: Payer: Self-pay | Admitting: Anesthesiology

## 2019-10-27 LAB — BIRTH TISSUE RECOVERY COLLECTION (PLACENTA DONATION)

## 2019-10-27 MED ORDER — IBUPROFEN 800 MG PO TABS: 800.0000 mg | ORAL_TABLET | Freq: Three times a day (TID) | ORAL | 1 refills | Status: DC | PRN

## 2019-10-27 MED ORDER — OXYCODONE-ACETAMINOPHEN 5-325 MG PO TABS: 1.0000 | ORAL_TABLET | ORAL | 0 refills | Status: AC | PRN

## 2019-10-27 NOTE — Anesthesia Postprocedure Evaluation (Signed)
Anesthesia Post Note  Patient: Kaylee Brown  Procedure(s) Performed: CESAREAN SECTION (N/A Abdomen)     Patient location during evaluation: Mother Baby Anesthesia Type: Epidural Level of consciousness: oriented and awake and alert Pain management: pain level controlled Vital Signs Assessment: post-procedure vital signs reviewed and stable Respiratory status: spontaneous breathing and respiratory function stable Cardiovascular status: blood pressure returned to baseline and stable Postop Assessment: no headache, no backache, no apparent nausea or vomiting and able to ambulate Anesthetic complications: no    Last Vitals:  Vitals:   10/26/19 1800 10/26/19 2015  BP: 120/73 111/68  Pulse: 68 62  Resp: 18 18  Temp: 36.8 C 36.7 C  SpO2: 99% 99%    Last Pain:  Vitals:   10/26/19 2337  TempSrc:   PainSc: 0-No pain   Pain Goal: Patients Stated Pain Goal: 0 (10/26/19 0100)                 Trevor Iha

## 2019-10-27 NOTE — Discharge Instructions (Signed)
Call office with any concerns [phone] 

## 2019-10-27 NOTE — Addendum Note (Signed)
Addendum  created 10/27/19 1155 by Mal Amabile, MD   Intraprocedure Event edited

## 2019-10-27 NOTE — Progress Notes (Signed)
Subjective: Postpartum Day 2: Cesarean Delivery Patient reports tolerating PO, + flatus and no problems voiding.  Reports pain controlled with meds. Denies HA/SOB/CP or blurry vision. She is bonding well with baby. She would like discharge to home today.  Objective: Vital signs in last 24 hours: Temp:  [98 F (36.7 C)-98.6 F (37 C)] 98.3 F (36.8 C) (04/19 0507) Pulse Rate:  [60-68] 67 (04/19 0507) Resp:  [18] 18 (04/19 0507) BP: (111-122)/(68-82) 122/82 (04/19 0507) SpO2:  [97 %-100 %] 100 % (04/19 0507)  Physical Exam:  General: alert, cooperative and no distress Lochia: appropriate Uterine Fundus: firm Incision: no significant drainage DVT Evaluation: No evidence of DVT seen on physical exam. Calf/Ankle edema is present.  Recent Labs    10/25/19 0454 10/26/19 0418  HGB 11.9* 12.7  HCT 35.9* 38.4    Assessment/Plan: Status post Cesarean section. Doing well postoperatively.  Continue current care Okt o discharge to home if baby stable per peds. \ Instructions reviewed.  Cathrine Muster 10/27/2019, 8:35 AM

## 2019-10-27 NOTE — Discharge Summary (Signed)
OB Discharge Summary     Patient Name: Kaylee Brown DOB: 03-Aug-1985 MRN: [id number]  Date of admission: 10/24/2019 Delivering MD: Huel Cote   Date of discharge: 10/27/2019  Admitting diagnosis: Gestational hypertension, third trimester [O13.3] Status post primary low transverse cesarean section [Z98.891] Intrauterine pregnancy: [redacted]w[redacted]d     Secondary diagnosis:  Active Problems:   Gestational hypertension, third trimester   Status post primary low transverse cesarean section  Additional problems: none     Discharge diagnosis: Term Pregnancy Delivered and Gestational Hypertension                                                                                                Post partum procedures:none  Augmentation: AROM, Pitocin, Cytotec and Foley Balloon  Complications: None  Hospital course:  Induction of Labor With Cesarean Section  34 y.o. yo G2P1011 at [redacted]w[redacted]d was admitted to the hospital 10/24/2019 for induction of labor. Patient had a labor course significant for protracted first stage. The patient went for cesarean section due to Non-Reassuring FHR, and delivered a Viable infant,10/25/2019  Membrane Rupture Time/Date: 3:31 AM ,10/25/2019   Details of operation can be found in separate operative Note.  Patient had an uncomplicated postpartum course. She is ambulating, tolerating a regular diet, passing flatus, and urinating well.  Patient is discharged home in stable condition on 10/27/19.                                    Physical exam  Vitals:   10/26/19 1400 10/26/19 1800 10/26/19 2015 10/27/19 0507  BP: 115/77 120/73 111/68 122/82  Pulse: 66 68 62 67  Resp: 18 18 18 18   Temp: 98.2 F (36.8 C) 98.2 F (36.8 C) 98 F (36.7 C) 98.3 F (36.8 C)  TempSrc: Oral Oral Oral Oral  SpO2: 98% 99% 99% 100%  Weight:      Height:       General: alert, cooperative and no distress Lochia: appropriate Uterine Fundus: firm Incision: Dressing is clean, dry, and  intact DVT Evaluation: Calf/Ankle edema is present Labs: Lab Results  Component Value Date   WBC 19.4 (H) 10/26/2019   HGB 12.7 10/26/2019   HCT 38.4 10/26/2019   MCV 95.5 10/26/2019   PLT 222 10/26/2019   CMP Latest Ref Rng & Units 10/24/2019  Glucose 70 - 99 mg/dL 91  BUN 6 - 20 mg/dL 14  Creatinine 10/26/2019 - 5.02 mg/dL 7.74  Sodium 1.28 - 786 mmol/L 137  Potassium 3.5 - 5.1 mmol/L 3.7  Chloride 98 - 111 mmol/L 107  CO2 22 - 32 mmol/L 19(L)  Calcium 8.9 - 10.3 mg/dL 9.2  Total Protein 6.5 - 8.1 g/dL 6.4(L)  Total Bilirubin 0.3 - 1.2 mg/dL 767)  Alkaline Phos 38 - 126 U/L 94  AST 15 - 41 U/L 18  ALT 0 - 44 U/L 14    Discharge instruction: per After Visit Summary and "Baby and Me Booklet".  After visit meds:  Allergies as of 10/27/2019   No Known  Allergies     Medication List    TAKE these medications   ibuprofen 800 MG tablet Commonly known as: ADVIL Take 1 tablet (800 mg total) by mouth every 8 (eight) hours as needed for moderate pain or cramping.   oxyCODONE-acetaminophen 5-325 MG tablet Commonly known as: Percocet Take 1 tablet by mouth every 4 (four) hours as needed for up to 7 days for severe pain.   prenatal multivitamin Tabs tablet Take 1 tablet by mouth at bedtime.   Voltaren 1 % Gel Generic drug: diclofenac Sodium Apply 2 g topically 2 (two) times daily as needed (For varicose veins.).       Diet: low salt diet  Activity: Advance as tolerated. Pelvic rest for 6 weeks.   Outpatient follow DX:[facility] office to schedule BP check on 4/23 and for incision check in 2 weeks. Routine postpartum visit in 6 weeks Follow up Appt:No future appointments. Follow up Visit:No follow-ups on file.  Postpartum contraception: Not Discussed  Newborn Data: Live born female  Birth Weight: 5 lb 12.2 oz (2615 g) APGAR: 76, 9  Newborn Delivery   Birth date/time: 10/25/2019 22:58:00 Delivery type: C-Section, Low Transverse Trial of labor: No C-section categorization:  Primary      Baby Feeding: Breast Disposition:home with mother   10/27/2019 Isaiah Serge, DO

## 2019-10-28 ENCOUNTER — Other Ambulatory Visit (HOSPITAL_COMMUNITY): Payer: BC Managed Care – PPO

## 2019-10-30 ENCOUNTER — Inpatient Hospital Stay (HOSPITAL_COMMUNITY): Payer: BC Managed Care – PPO

## 2019-11-10 DIAGNOSIS — R309 Painful micturition, unspecified: Secondary | ICD-10-CM | POA: Diagnosis not present

## 2019-12-09 DIAGNOSIS — Z1389 Encounter for screening for other disorder: Secondary | ICD-10-CM | POA: Diagnosis not present

## 2019-12-09 DIAGNOSIS — Z30011 Encounter for initial prescription of contraceptive pills: Secondary | ICD-10-CM | POA: Diagnosis not present

## 2019-12-09 DIAGNOSIS — Z3009 Encounter for other general counseling and advice on contraception: Secondary | ICD-10-CM | POA: Diagnosis not present

## 2020-02-13 DIAGNOSIS — M549 Dorsalgia, unspecified: Secondary | ICD-10-CM | POA: Diagnosis not present

## 2020-02-13 DIAGNOSIS — R002 Palpitations: Secondary | ICD-10-CM | POA: Diagnosis not present

## 2020-03-03 DIAGNOSIS — J069 Acute upper respiratory infection, unspecified: Secondary | ICD-10-CM | POA: Diagnosis not present

## 2020-03-04 DIAGNOSIS — R05 Cough: Secondary | ICD-10-CM | POA: Diagnosis not present

## 2020-03-04 DIAGNOSIS — Z20822 Contact with and (suspected) exposure to covid-19: Secondary | ICD-10-CM | POA: Diagnosis not present

## 2020-04-06 DIAGNOSIS — Z20822 Contact with and (suspected) exposure to covid-19: Secondary | ICD-10-CM | POA: Diagnosis not present

## 2020-04-06 DIAGNOSIS — Z03818 Encounter for observation for suspected exposure to other biological agents ruled out: Secondary | ICD-10-CM | POA: Diagnosis not present

## 2020-04-06 DIAGNOSIS — J069 Acute upper respiratory infection, unspecified: Secondary | ICD-10-CM | POA: Diagnosis not present

## 2020-04-07 DIAGNOSIS — J069 Acute upper respiratory infection, unspecified: Secondary | ICD-10-CM | POA: Diagnosis not present

## 2020-07-19 DIAGNOSIS — Z20828 Contact with and (suspected) exposure to other viral communicable diseases: Secondary | ICD-10-CM | POA: Diagnosis not present

## 2020-07-20 DIAGNOSIS — Z20828 Contact with and (suspected) exposure to other viral communicable diseases: Secondary | ICD-10-CM | POA: Diagnosis not present

## 2020-09-13 DIAGNOSIS — N911 Secondary amenorrhea: Secondary | ICD-10-CM | POA: Diagnosis not present

## 2020-09-13 DIAGNOSIS — Z3201 Encounter for pregnancy test, result positive: Secondary | ICD-10-CM | POA: Diagnosis not present

## 2020-09-22 ENCOUNTER — Encounter (HOSPITAL_COMMUNITY): Payer: Self-pay | Admitting: Obstetrics and Gynecology

## 2020-09-22 ENCOUNTER — Other Ambulatory Visit: Payer: Self-pay

## 2020-09-22 ENCOUNTER — Inpatient Hospital Stay (HOSPITAL_COMMUNITY)
Admission: AD | Admit: 2020-09-22 | Discharge: 2020-09-23 | Disposition: A | Discharge status: Home / Self Care | Payer: BC Managed Care – PPO | Attending: Obstetrics and Gynecology | Admitting: Obstetrics and Gynecology

## 2020-09-22 DIAGNOSIS — O211 Hyperemesis gravidarum with metabolic disturbance: Secondary | ICD-10-CM | POA: Diagnosis not present

## 2020-09-22 DIAGNOSIS — E860 Dehydration: Secondary | ICD-10-CM | POA: Diagnosis not present

## 2020-09-22 DIAGNOSIS — Z79899 Other long term (current) drug therapy: Secondary | ICD-10-CM | POA: Insufficient documentation

## 2020-09-22 DIAGNOSIS — O219 Vomiting of pregnancy, unspecified: Secondary | ICD-10-CM | POA: Diagnosis not present

## 2020-09-22 DIAGNOSIS — O99281 Endocrine, nutritional and metabolic diseases complicating pregnancy, first trimester: Secondary | ICD-10-CM | POA: Insufficient documentation

## 2020-09-22 DIAGNOSIS — Z3A08 8 weeks gestation of pregnancy: Secondary | ICD-10-CM | POA: Diagnosis not present

## 2020-09-22 DIAGNOSIS — Z87891 Personal history of nicotine dependence: Secondary | ICD-10-CM | POA: Diagnosis not present

## 2020-09-22 MED ORDER — ONDANSETRON 4 MG PO TBDP
4.0000 mg | ORAL_TABLET | Freq: Once | ORAL | Status: AC
  Administered 2020-09-23: 4 mg via ORAL
  Filled 2020-09-22: qty 1

## 2020-09-22 MED ORDER — LACTATED RINGERS IV SOLN: INTRAVENOUS | Status: AC

## 2020-09-22 NOTE — MAU Note (Signed)
Has been having n/v for 3 wks. Keeping 3 toasts down a day and that's about it. Has been taking Reglan which helps about an hour or so and then stops working. Had tried B6 and Unisom before that but switched to Reglan. Denies VB. Mild cramping on occ. Some loose stools for couple days.
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
## 2020-09-23 ENCOUNTER — Encounter (HOSPITAL_COMMUNITY): Payer: Self-pay | Admitting: Obstetrics and Gynecology

## 2020-09-23 LAB — URINALYSIS, ROUTINE W REFLEX MICROSCOPIC
Bilirubin Urine: NEGATIVE
Glucose, UA: NEGATIVE mg/dL
Hgb urine dipstick: NEGATIVE
Ketones, ur: 5 mg/dL — AB
Leukocytes,Ua: NEGATIVE
Nitrite: NEGATIVE
Protein, ur: 30 mg/dL — AB
Specific Gravity, Urine: 1.018 (ref 1.005–1.030)
pH: 5 (ref 5.0–8.0)

## 2020-09-23 MED ORDER — ONDANSETRON 4 MG PO TBDP: 4.0000 mg | ORAL_TABLET | Freq: Four times a day (QID) | ORAL | 0 refills | Status: DC | PRN

## 2020-09-23 MED ORDER — PROMETHAZINE HCL 25 MG PO TABS: 25.0000 mg | ORAL_TABLET | Freq: Four times a day (QID) | ORAL | 2 refills | Status: DC | PRN

## 2020-09-23 MED ORDER — VITAMIN B-6 50 MG PO TABS: 50.0000 mg | ORAL_TABLET | Freq: Every day | ORAL | 0 refills | Status: DC

## 2020-09-23 NOTE — Progress Notes (Signed)
Marie Williams CNM in to discuss d/c plan. Written and verbal d/c instructions given and understanding voiced. 

## 2020-09-23 NOTE — Discharge Instructions (Signed)
Morning Sickness  Morning sickness is when a woman feels nauseous during pregnancy. This nauseous feeling may or may not come with vomiting. It often occurs in the morning, but it can be a problem at any time of day. Morning sickness is most common during the first trimester. In some cases, it may continue throughout pregnancy. Although morning sickness is unpleasant, it is usually harmless unless the woman develops severe and continual vomiting (hyperemesis gravidarum), a condition that requires more intense treatment. What are the causes? The exact cause of this condition is not known, but it seems to be related to normal hormonal changes that occur in pregnancy. What increases the risk? You are more likely to develop this condition if:  You experienced nausea or vomiting before your pregnancy.  You had morning sickness during a previous pregnancy.  You are pregnant with more than one baby, such as twins. What are the signs or symptoms? Symptoms of this condition include:  Nausea.  Vomiting. How is this diagnosed? This condition is usually diagnosed based on your signs and symptoms. How is this treated? In many cases, treatment is not needed for this condition. Making some changes to what you eat may help to control symptoms. Your health care provider may also prescribe or recommend:  Vitamin B6 supplements.  Anti-nausea medicines.  Ginger. Follow these instructions at home: Medicines  Take over-the-counter and prescription medicines only as told by your health care provider. Do not use any prescription, over-the-counter, or herbal medicines for morning sickness without first talking with your health care provider.  Take multivitamins before getting pregnant. This can prevent or decrease the severity of morning sickness in most women. Eating and drinking  Eat a piece of dry toast or crackers before getting out of bed in the morning.  Eat 5 or 6 small meals a day.  Eat dry  and bland foods, such as rice or a baked potato. Foods that are high in carbohydrates are often helpful.  Avoid greasy, fatty, and spicy foods.  Have someone cook for you if the smell of any food causes nausea and vomiting.  If you feel nauseous after taking prenatal vitamins, take the vitamins at night or with a snack.  Eat a protein snack between meals if you are hungry. Nuts, yogurt, and cheese are good options.  Drink fluids throughout the day.  Try ginger ale made with real ginger, ginger tea made from fresh grated ginger, or ginger candies. General instructions  Do not use any products that contain nicotine or tobacco. These products include cigarettes, chewing tobacco, and vaping devices, such as e-cigarettes. If you need help quitting, ask your health care provider.  Get an air purifier to keep the air in your house free of odors.  Get plenty of fresh air.  Try to avoid odors that trigger your nausea.  Consider trying these methods to help relieve symptoms: ? Wearing an acupressure wristband. These wristbands are often worn for seasickness. ? Acupuncture. Contact a health care provider if:  Your home remedies are not working and you need medicine.  You feel dizzy or light-headed.  You are losing weight. Get help right away if:  You have persistent and uncontrolled nausea and vomiting.  You faint.  You have severe pain in your abdomen. Summary  Morning sickness is when a woman feels nauseous during pregnancy. This nauseous feeling may or may not come with vomiting.  Morning sickness is most common during the first trimester.  It often occurs in the   morning, but it can be a problem at any time of day.  In many cases, treatment is not needed for this condition. Making some changes to what you eat may help to control symptoms. This information is not intended to replace advice given to you by your health care provider. Make sure you discuss any questions you have  with your health care provider. Document Revised: 02/09/2020 Document Reviewed: 01/19/2020 Elsevier Patient Education  2021 Elsevier Inc.  

## 2020-10-01 DIAGNOSIS — J069 Acute upper respiratory infection, unspecified: Secondary | ICD-10-CM | POA: Diagnosis not present

## 2020-10-02 DIAGNOSIS — J069 Acute upper respiratory infection, unspecified: Secondary | ICD-10-CM | POA: Diagnosis not present

## 2020-10-02 DIAGNOSIS — Z03818 Encounter for observation for suspected exposure to other biological agents ruled out: Secondary | ICD-10-CM | POA: Diagnosis not present

## 2020-10-05 DIAGNOSIS — Z124 Encounter for screening for malignant neoplasm of cervix: Secondary | ICD-10-CM | POA: Diagnosis not present

## 2020-10-05 DIAGNOSIS — Z3689 Encounter for other specified antenatal screening: Secondary | ICD-10-CM | POA: Diagnosis not present

## 2020-10-05 DIAGNOSIS — Z113 Encounter for screening for infections with a predominantly sexual mode of transmission: Secondary | ICD-10-CM | POA: Diagnosis not present

## 2020-10-05 DIAGNOSIS — Z3A09 9 weeks gestation of pregnancy: Secondary | ICD-10-CM | POA: Diagnosis not present

## 2020-10-05 DIAGNOSIS — O09891 Supervision of other high risk pregnancies, first trimester: Secondary | ICD-10-CM | POA: Diagnosis not present

## 2020-10-05 LAB — OB RESULTS CONSOLE RUBELLA ANTIBODY, IGM: Rubella: NON-IMMUNE/NOT IMMUNE

## 2020-10-05 LAB — OB RESULTS CONSOLE HIV ANTIBODY (ROUTINE TESTING): HIV: NONREACTIVE

## 2020-10-05 LAB — OB RESULTS CONSOLE RPR: RPR: NONREACTIVE

## 2020-10-05 LAB — OB RESULTS CONSOLE GC/CHLAMYDIA
Chlamydia: NEGATIVE
Gonorrhea: NEGATIVE

## 2020-10-05 LAB — OB RESULTS CONSOLE ANTIBODY SCREEN: Antibody Screen: NEGATIVE

## 2020-10-05 LAB — OB RESULTS CONSOLE HEPATITIS B SURFACE ANTIGEN: Hepatitis B Surface Ag: NEGATIVE

## 2020-10-05 LAB — OB RESULTS CONSOLE ABO/RH: RH Type: POSITIVE

## 2020-10-06 DIAGNOSIS — Z124 Encounter for screening for malignant neoplasm of cervix: Secondary | ICD-10-CM | POA: Diagnosis not present

## 2020-10-06 DIAGNOSIS — Z1151 Encounter for screening for human papillomavirus (HPV): Secondary | ICD-10-CM | POA: Diagnosis not present

## 2020-10-13 ENCOUNTER — Other Ambulatory Visit: Payer: Self-pay

## 2020-10-13 ENCOUNTER — Inpatient Hospital Stay (HOSPITAL_COMMUNITY)
Admission: AD | Admit: 2020-10-13 | Discharge: 2020-10-13 | Disposition: A | Discharge status: Home / Self Care | Payer: BC Managed Care – PPO | Attending: Obstetrics and Gynecology | Admitting: Obstetrics and Gynecology

## 2020-10-13 ENCOUNTER — Encounter (HOSPITAL_COMMUNITY): Payer: Self-pay | Admitting: Obstetrics and Gynecology

## 2020-10-13 ENCOUNTER — Inpatient Hospital Stay (HOSPITAL_BASED_OUTPATIENT_CLINIC_OR_DEPARTMENT_OTHER): Payer: BC Managed Care – PPO

## 2020-10-13 DIAGNOSIS — R609 Edema, unspecified: Secondary | ICD-10-CM | POA: Diagnosis not present

## 2020-10-13 DIAGNOSIS — O2221 Superficial thrombophlebitis in pregnancy, first trimester: Secondary | ICD-10-CM | POA: Insufficient documentation

## 2020-10-13 DIAGNOSIS — O99111 Other diseases of the blood and blood-forming organs and certain disorders involving the immune mechanism complicating pregnancy, first trimester: Secondary | ICD-10-CM

## 2020-10-13 DIAGNOSIS — Z87891 Personal history of nicotine dependence: Secondary | ICD-10-CM | POA: Insufficient documentation

## 2020-10-13 DIAGNOSIS — I82811 Embolism and thrombosis of superficial veins of right lower extremities: Secondary | ICD-10-CM

## 2020-10-13 DIAGNOSIS — Z8669 Personal history of other diseases of the nervous system and sense organs: Secondary | ICD-10-CM | POA: Insufficient documentation

## 2020-10-13 DIAGNOSIS — Z3A10 10 weeks gestation of pregnancy: Secondary | ICD-10-CM

## 2020-10-13 DIAGNOSIS — R52 Pain, unspecified: Secondary | ICD-10-CM

## 2020-10-13 DIAGNOSIS — M79606 Pain in leg, unspecified: Secondary | ICD-10-CM | POA: Diagnosis not present

## 2020-10-13 MED ORDER — ENOXAPARIN SODIUM 40 MG/0.4ML ~~LOC~~ SOLN: 40.0000 mg | SUBCUTANEOUS | 0 refills | Status: DC

## 2020-10-13 NOTE — Progress Notes (Signed)
Right lower extremity venous duplex completed. Refer to "CV Proc" under chart review to view preliminary results.  Preliminary results discussed with Joni Reining, RN.  10/13/2020 2:01 PM Eula Fried., MHA, RVT, RDCS, RDMS
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
## 2020-10-13 NOTE — Discharge Instructions (Signed)
Present to urgent care or emergency department for fever, chest pain, shortness of breath, or drainage from the affected area of your leg.     Anticoagulant Injection Instructions Using a Prefilled Syringe  Injectable blood thinners (anticoagulants) are medicines that help prevent blood clots from developing. Two common injectable anticoagulant medicines that are used are fondaparinux and enoxaparin. Injectable anticoagulant medicines are given with a single-use syringe that already has medicine inside of it (prefilled syringe). You inject the medicine through a needle into the layer of fat and tissue between skin and muscle (subcutaneous) in your belly. Before your first injection, your health care provider will instruct you on how to take your anticoagulant medicine at home. Also read the medication guide or package insert that came with the prefilled syringe. Follow directions from the guide about how to prepare and give the injection. What are the risks? Generally, self-injection of anticoagulant medicine is safe. However, mild problems can occur, including mild bleeding, itching, or rash at the injection site. Other risks may include:  Bleeding and bruising.  A low platelet count (thrombocytopenia).  An allergic reaction to the medication.  Liver damage.  A low red blood cell count (anemia). Supplies needed: To inject the medicine, you will need:  Alcohol wipes.  A prefilled syringe with needle.  A container for syringe disposal. This may be a puncture-proof sharps container or a hard-sided plastic container with a cover, such as an empty laundry detergent bottle. How to inject anticoagulant medicine 1. Wash your hands with soap and water. If soap and water are not available, use alcohol-based hand sanitizer. 2. Locate the site on your belly where the medicine should be injected. Avoid the area within 2 inches (5 cm) of your navel (umbilicus). 3. Use an alcohol wipe to clean the  site where you will be injecting the needle. Let the site air-dry. 4. Remove the plastic cover from the needle on the syringe. Do not let the needle touch anything. 5. Hold the syringe with one hand and use your other hand to twist the rigid needle guard (covering the needle) counterclockwise. Pull the needle guard straight off the needle. Discard the needle guard. 6. When using a prefilled syringe, do not push the air bubble out of the syringe before the injection. The air bubble will help you get all of the medicine out of the syringe and into your subcutaneous tissue. 7. Hold the syringe in your writing hand like a pencil. 8. Use your other hand to pinch and hold about an inch (2.5 cm) of skin. Do not directly touch the cleaned part of the skin. 9. Insert the entire needle straight into the fold of skin. The needle should be at a 90-degree angle (perpendicular) to the skin. Push the needle all the way against the skin. 10. After the needle is completely inserted into the skin, release the skin that you are pinching. Continue to hold the syringe with your writing hand. 11. Use the thumb or index finger of your writing hand to push the plunger all the way into the syringe to inject the medicine. 12. Pull the needle straight out of the skin. 13. Press and hold an alcohol wipe over the injection site until the bleeding stops. Do not rub the area. 14. Cover the injection site with a bandage, if needed. 15. Do not recap the needle. 16. If your syringe has a safety system for shielding the needle after injection: ? Firmly push down on the plunger after you  complete the injection. The protective sleeve will automatically cover the needle, and you will hear a click. The click means that the needle is safely covered. 17. Place the syringe and needle in the disposal container. What else do I need to know?  Do not use the syringe or needle more than one time.  Change the injection site each time you give  yourself a shot.  Before an injection, make sure the medicine is a clear and colorless or pale yellow. Do not use your medicine if it is discolored or has particles in it. Let your health care provider know right away.  Tell your health care providers, including dentists: ? That you are taking an anticoagulant, especially if you are injured or plan to have a procedure. ? If there is a change in your illness, and any other changes in medicines, supplements, or diet.  Take over-the-counter and prescription medicines only as told by your health care provider.  Keep your medicine safely stored at room temperature.  Keep all follow-up visits as told by your health care provider. Contact a health care provider if:  You develop any rashes on your skin.  You have large areas of bruising on your skin.  Your condition worsens.  You develop a fever.  There is blood in your urine.  You are bleeding from your gums. Get help right away if:  You develop bleeding problems such as: ? Vomiting blood or coughing up blood. ? Dark red blood in your urine. ? Blood in your stool or your stool has a dark, tarry, or coffee-ground appearance.  You have bleeding that does not stop.  You develop chest pain or shortness of breath.  You develop a severe headache or confusion. These symptoms may represent a serious problem that is an emergency. Do not wait to see if the symptoms will go away. Get medical help right away. Call your local emergency services (911 in the [place] Do not drive yourself to the hospital. Summary  Injectable blood thinners (anticoagulants) are medicines that help prevent blood clots from developing in the veins.  You inject the medicine through a needle into the layer of fat and tissue between skin and muscle (subcutaneous) in your belly.  Keep all follow-up visits as told by your health care provider. Follow-up visits include visits for lab tests. This information is not  intended to replace advice given to you by your health care provider. Make sure you discuss any questions you have with your health care provider. Document Revised: 08/04/2017 Document Reviewed: 08/04/2017 Elsevier Patient Education  2021 [employer].

## 2020-10-13 NOTE — MAU Note (Signed)
Presents with c/o pain in right lateral leg that began Monday.  Reports there's a knot and leg feels red & is hot. Denies VB or LOF.

## 2020-11-10 DIAGNOSIS — Z1379 Encounter for other screening for genetic and chromosomal anomalies: Secondary | ICD-10-CM | POA: Diagnosis not present

## 2020-12-07 DIAGNOSIS — O479 False labor, unspecified: Secondary | ICD-10-CM | POA: Diagnosis not present

## 2020-12-13 DIAGNOSIS — Z363 Encounter for antenatal screening for malformations: Secondary | ICD-10-CM | POA: Diagnosis not present

## 2020-12-13 DIAGNOSIS — Z3A18 18 weeks gestation of pregnancy: Secondary | ICD-10-CM | POA: Diagnosis not present

## 2020-12-14 ENCOUNTER — Other Ambulatory Visit (HOSPITAL_COMMUNITY): Payer: Self-pay | Admitting: Obstetrics and Gynecology

## 2020-12-14 ENCOUNTER — Ambulatory Visit (HOSPITAL_COMMUNITY)
Admission: RE | Admit: 2020-12-14 | Discharge: 2020-12-14 | Disposition: A | Discharge status: Home / Self Care | Payer: BC Managed Care – PPO | Source: Ambulatory Visit | Attending: Obstetrics and Gynecology | Admitting: Obstetrics and Gynecology

## 2020-12-14 ENCOUNTER — Other Ambulatory Visit: Payer: Self-pay

## 2020-12-14 ENCOUNTER — Encounter (HOSPITAL_COMMUNITY): Payer: Self-pay | Admitting: Obstetrics and Gynecology

## 2020-12-14 DIAGNOSIS — M79604 Pain in right leg: Secondary | ICD-10-CM

## 2020-12-14 NOTE — Progress Notes (Signed)
Lower extremity venous has been completed.   Preliminary results in CV Proc.   Blanch Media 12/14/2020 3:03 PM

## 2021-01-05 ENCOUNTER — Other Ambulatory Visit: Payer: Self-pay | Admitting: Advanced Practice Midwife

## 2021-01-05 DIAGNOSIS — R519 Headache, unspecified: Secondary | ICD-10-CM | POA: Diagnosis not present

## 2021-02-14 DIAGNOSIS — Z3689 Encounter for other specified antenatal screening: Secondary | ICD-10-CM | POA: Diagnosis not present

## 2021-02-14 DIAGNOSIS — Z23 Encounter for immunization: Secondary | ICD-10-CM | POA: Diagnosis not present

## 2021-02-15 DIAGNOSIS — Z3A28 28 weeks gestation of pregnancy: Secondary | ICD-10-CM | POA: Diagnosis not present

## 2021-02-15 DIAGNOSIS — O368199 Decreased fetal movements, unspecified trimester, other fetus: Secondary | ICD-10-CM | POA: Diagnosis not present

## 2021-03-24 DIAGNOSIS — O139 Gestational [pregnancy-induced] hypertension without significant proteinuria, unspecified trimester: Secondary | ICD-10-CM | POA: Diagnosis not present

## 2021-04-12 DIAGNOSIS — Z3685 Encounter for antenatal screening for Streptococcus B: Secondary | ICD-10-CM | POA: Diagnosis not present

## 2021-04-13 LAB — OB RESULTS CONSOLE GBS: GBS: NEGATIVE

## 2021-04-27 ENCOUNTER — Encounter (HOSPITAL_COMMUNITY): Payer: Self-pay

## 2021-04-27 NOTE — Patient Instructions (Signed)
Kaylee Brown  04/27/2021   Your procedure is scheduled on:  05/03/2021  Arrive at 0530 at [facility] [employer] on [address] at [facility]  and [employer]. You are invited to use the FREE valet parking or use the Visitor's parking deck.  Pick up the phone at the desk and dial [phone].  Call this number if you have problems the morning of surgery: [phone]  Remember:   Do not eat food:(After Midnight) Desps de medianoche.  Do not drink clear liquids: (After Midnight) Desps de medianoche.  Take these medicines the morning of surgery with A SIP OF WATER:  none   Do not wear jewelry, make-up or nail polish.  Do not wear lotions, powders, or perfumes. Do not wear deodorant.  Do not shave 48 hours prior to surgery.  Do not bring valuables to the hospital.  [facility] is not   responsible for any belongings or valuables brought to the hospital.  Contacts, dentures or bridgework may not be worn into surgery.  Leave suitcase in the car. After surgery it may be brought to your room.  For patients admitted to the hospital, checkout time is 11:00 AM the day of              discharge.      Please read over the following fact sheets that you were given:     Preparing for Surgery

## 2021-05-02 ENCOUNTER — Other Ambulatory Visit: Payer: Self-pay

## 2021-05-02 ENCOUNTER — Other Ambulatory Visit: Payer: Self-pay | Admitting: Obstetrics and Gynecology

## 2021-05-02 ENCOUNTER — Encounter (HOSPITAL_COMMUNITY)
Admission: RE | Admit: 2021-05-02 | Discharge: 2021-05-02 | Disposition: A | Discharge status: Home / Self Care | Payer: BC Managed Care – PPO | Source: Ambulatory Visit | Attending: Obstetrics and Gynecology | Admitting: Obstetrics and Gynecology

## 2021-05-02 DIAGNOSIS — Z98891 History of uterine scar from previous surgery: Secondary | ICD-10-CM | POA: Diagnosis not present

## 2021-05-02 DIAGNOSIS — D638 Anemia in other chronic diseases classified elsewhere: Secondary | ICD-10-CM | POA: Diagnosis not present

## 2021-05-02 DIAGNOSIS — O134 Gestational [pregnancy-induced] hypertension without significant proteinuria, complicating childbirth: Secondary | ICD-10-CM | POA: Diagnosis not present

## 2021-05-02 DIAGNOSIS — O9902 Anemia complicating childbirth: Secondary | ICD-10-CM | POA: Diagnosis not present

## 2021-05-02 DIAGNOSIS — O34211 Maternal care for low transverse scar from previous cesarean delivery: Secondary | ICD-10-CM | POA: Diagnosis not present

## 2021-05-02 DIAGNOSIS — O34219 Maternal care for unspecified type scar from previous cesarean delivery: Secondary | ICD-10-CM

## 2021-05-02 DIAGNOSIS — Z3A00 Weeks of gestation of pregnancy not specified: Secondary | ICD-10-CM | POA: Diagnosis not present

## 2021-05-02 DIAGNOSIS — Z3A39 39 weeks gestation of pregnancy: Secondary | ICD-10-CM | POA: Diagnosis not present

## 2021-05-02 DIAGNOSIS — Z87891 Personal history of nicotine dependence: Secondary | ICD-10-CM | POA: Diagnosis not present

## 2021-05-02 DIAGNOSIS — Z01812 Encounter for preprocedural laboratory examination: Secondary | ICD-10-CM | POA: Insufficient documentation

## 2021-05-02 HISTORY — DX: Personal history of other complications of pregnancy, childbirth and the puerperium: Z87.59

## 2021-05-02 LAB — CBC
HCT: 30.9 % — ABNORMAL LOW (ref 36.0–46.0)
Hemoglobin: 9.7 g/dL — ABNORMAL LOW (ref 12.0–15.0)
MCH: 28.2 pg (ref 26.0–34.0)
MCHC: 31.4 g/dL (ref 30.0–36.0)
MCV: 89.8 fL (ref 80.0–100.0)
Platelets: 262 10*3/uL (ref 150–400)
RBC: 3.44 MIL/uL — ABNORMAL LOW (ref 3.87–5.11)
RDW: 13.1 % (ref 11.5–15.5)
WBC: 8.3 10*3/uL (ref 4.0–10.5)
nRBC: 0 % (ref 0.0–0.2)

## 2021-05-02 LAB — SARS CORONAVIRUS 2 (TAT 6-24 HRS): SARS Coronavirus 2: NEGATIVE

## 2021-05-02 LAB — TYPE AND SCREEN
ABO/RH(D): A POS
Antibody Screen: NEGATIVE
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
## 2021-05-02 NOTE — Anesthesia Preprocedure Evaluation (Addendum)
Anesthesia Evaluation  Patient identified by MRN, date of birth, ID band Patient awake    Reviewed: Allergy & Precautions, NPO status , Patient's Chart, lab work & pertinent test results  Airway Mallampati: II  TM Distance: >3 FB Neck ROM: Full    Dental no notable dental hx. (+) Teeth Intact, Dental Advisory Given   Pulmonary former smoker,    Pulmonary exam normal breath sounds clear to auscultation       Cardiovascular hypertension, Normal cardiovascular exam Rhythm:Regular Rate:Normal     Neuro/Psych    GI/Hepatic Neg liver ROS,   Endo/Other  negative endocrine ROS  Renal/GU negative Renal ROS     Musculoskeletal   Abdominal (+) + obese (BMI 36.87),   Peds  Hematology   Anesthesia Other Findings   Reproductive/Obstetrics (+) Pregnancy                            Anesthesia Physical Anesthesia Plan  ASA: 3  Anesthesia Plan: Spinal   Post-op Pain Management:    Induction:   PONV Risk Score and Plan: 3 and Treatment may vary due to age or medical condition  Airway Management Planned: Nasal Cannula and Natural Airway  Additional Equipment: None  Intra-op Plan:   Post-operative Plan:   Informed Consent: I have reviewed the patients History and Physical, chart, labs and discussed the procedure including the risks, benefits and alternatives for the proposed anesthesia with the patient or authorized representative who has indicated his/her understanding and acceptance.     Dental advisory given  Plan Discussed with:   Anesthesia Plan Comments: (  39 wk G3P1 for Rpt C/S w Tubal Ligation)       Anesthesia Quick Evaluation

## 2021-05-03 ENCOUNTER — Inpatient Hospital Stay (HOSPITAL_COMMUNITY): Payer: BC Managed Care – PPO | Admitting: Anesthesiology

## 2021-05-03 ENCOUNTER — Encounter (HOSPITAL_COMMUNITY): Payer: Self-pay | Admitting: Obstetrics and Gynecology

## 2021-05-03 ENCOUNTER — Inpatient Hospital Stay (HOSPITAL_COMMUNITY)
Admission: RE | Admit: 2021-05-03 | Discharge: 2021-05-05 | DRG: 788 | Disposition: A | Discharge status: Home / Self Care | Payer: BC Managed Care – PPO | Attending: Obstetrics and Gynecology | Admitting: Obstetrics and Gynecology

## 2021-05-03 ENCOUNTER — Encounter (HOSPITAL_COMMUNITY)
Admission: RE | Disposition: A | Discharge status: Home / Self Care | Payer: Self-pay | Source: Home / Self Care | Attending: Obstetrics and Gynecology

## 2021-05-03 ENCOUNTER — Other Ambulatory Visit: Payer: Self-pay

## 2021-05-03 DIAGNOSIS — Z98891 History of uterine scar from previous surgery: Secondary | ICD-10-CM

## 2021-05-03 DIAGNOSIS — D638 Anemia in other chronic diseases classified elsewhere: Secondary | ICD-10-CM | POA: Diagnosis present

## 2021-05-03 DIAGNOSIS — O34211 Maternal care for low transverse scar from previous cesarean delivery: Principal | ICD-10-CM | POA: Diagnosis present

## 2021-05-03 DIAGNOSIS — D649 Anemia, unspecified: Secondary | ICD-10-CM | POA: Diagnosis present

## 2021-05-03 DIAGNOSIS — Z87891 Personal history of nicotine dependence: Secondary | ICD-10-CM

## 2021-05-03 DIAGNOSIS — O9902 Anemia complicating childbirth: Secondary | ICD-10-CM | POA: Diagnosis present

## 2021-05-03 DIAGNOSIS — O34219 Maternal care for unspecified type scar from previous cesarean delivery: Secondary | ICD-10-CM

## 2021-05-03 DIAGNOSIS — Z3A39 39 weeks gestation of pregnancy: Secondary | ICD-10-CM | POA: Diagnosis not present

## 2021-05-03 LAB — RPR: RPR Ser Ql: NONREACTIVE

## 2021-05-03 SURGERY — Surgical Case
Anesthesia: Spinal | Laterality: Bilateral | Wound class: Clean Contaminated

## 2021-05-03 MED ORDER — SCOPOLAMINE 1 MG/3DAYS TD PT72: 1.0000 | MEDICATED_PATCH | Freq: Once | TRANSDERMAL | Status: DC

## 2021-05-03 MED ORDER — MENTHOL 3 MG MT LOZG: 1.0000 | LOZENGE | OROMUCOSAL | Status: DC | PRN

## 2021-05-03 MED ORDER — NALBUPHINE HCL 10 MG/ML IJ SOLN
5.0000 mg | INTRAMUSCULAR | Status: DC | PRN
Start: 2021-05-03 — End: 2021-05-05

## 2021-05-03 MED ORDER — CEFAZOLIN SODIUM-DEXTROSE 2-3 GM-%(50ML) IV SOLR
INTRAVENOUS | Status: DC | PRN
  Administered 2021-05-03: 2 g via INTRAVENOUS

## 2021-05-03 MED ORDER — DIPHENHYDRAMINE HCL 25 MG PO CAPS: 25.0000 mg | ORAL_CAPSULE | Freq: Four times a day (QID) | ORAL | Status: DC | PRN

## 2021-05-03 MED ORDER — COCONUT OIL OIL: 1.0000 "application " | TOPICAL_OIL | Status: DC | PRN

## 2021-05-03 MED ORDER — LACTATED RINGERS IV SOLN: INTRAVENOUS | Status: DC

## 2021-05-03 MED ORDER — LACTATED RINGERS IV SOLN: INTRAVENOUS | Status: DC | PRN

## 2021-05-03 MED ORDER — MORPHINE SULFATE (PF) 0.5 MG/ML IJ SOLN
INTRAMUSCULAR | Status: DC | PRN
  Administered 2021-05-03: 150 ug via INTRATHECAL

## 2021-05-03 MED ORDER — KETOROLAC TROMETHAMINE 30 MG/ML IJ SOLN: 30.0000 mg | Freq: Once | INTRAMUSCULAR | Status: DC | PRN

## 2021-05-03 MED ORDER — SIMETHICONE 80 MG PO CHEW
80.0000 mg | CHEWABLE_TABLET | ORAL | Status: DC | PRN
  Administered 2021-05-03: 80 mg via ORAL
  Filled 2021-05-03 (×2): qty 1

## 2021-05-03 MED ORDER — SENNOSIDES-DOCUSATE SODIUM 8.6-50 MG PO TABS
2.0000 | ORAL_TABLET | Freq: Every day | ORAL | Status: DC
  Administered 2021-05-04 – 2021-05-05 (×2): 2 via ORAL
  Filled 2021-05-03 (×2): qty 2

## 2021-05-03 MED ORDER — TETANUS-DIPHTH-ACELL PERTUSSIS 5-2.5-18.5 LF-MCG/0.5 IM SUSY
0.5000 mL | PREFILLED_SYRINGE | Freq: Once | INTRAMUSCULAR | Status: DC
Start: 2021-05-04 — End: 2021-05-05

## 2021-05-03 MED ORDER — NALBUPHINE HCL 10 MG/ML IJ SOLN: 5.0000 mg | Freq: Once | INTRAMUSCULAR | Status: DC | PRN

## 2021-05-03 MED ORDER — ONDANSETRON HCL 4 MG/2ML IJ SOLN: 4.0000 mg | Freq: Three times a day (TID) | INTRAMUSCULAR | Status: DC | PRN

## 2021-05-03 MED ORDER — NALBUPHINE HCL 10 MG/ML IJ SOLN
5.0000 mg | Freq: Once | INTRAMUSCULAR | Status: DC | PRN
Start: 2021-05-03 — End: 2021-05-05

## 2021-05-03 MED ORDER — STERILE WATER FOR IRRIGATION IR SOLN
Status: DC | PRN
  Administered 2021-05-03: 1000 mL

## 2021-05-03 MED ORDER — ACETAMINOPHEN 10 MG/ML IV SOLN
INTRAVENOUS | Status: DC | PRN
  Administered 2021-05-03: 1000 mg via INTRAVENOUS

## 2021-05-03 MED ORDER — SIMETHICONE 80 MG PO CHEW
80.0000 mg | CHEWABLE_TABLET | Freq: Three times a day (TID) | ORAL | Status: DC
  Administered 2021-05-03 – 2021-05-05 (×5): 80 mg via ORAL
  Filled 2021-05-03 (×5): qty 1

## 2021-05-03 MED ORDER — OXYTOCIN-SODIUM CHLORIDE 30-0.9 UT/500ML-% IV SOLN: 2.5000 [IU]/h | INTRAVENOUS | Status: AC

## 2021-05-03 MED ORDER — POVIDONE-IODINE 10 % EX SWAB
2.0000 "application " | Freq: Once | CUTANEOUS | Status: AC
  Administered 2021-05-03: 2 via TOPICAL

## 2021-05-03 MED ORDER — ACETAMINOPHEN 10 MG/ML IV SOLN
INTRAVENOUS | Status: AC
  Filled 2021-05-03: qty 100

## 2021-05-03 MED ORDER — ONDANSETRON HCL 4 MG/2ML IJ SOLN
INTRAMUSCULAR | Status: AC
  Filled 2021-05-03: qty 2

## 2021-05-03 MED ORDER — DEXAMETHASONE SODIUM PHOSPHATE 4 MG/ML IJ SOLN
INTRAMUSCULAR | Status: DC | PRN
  Administered 2021-05-03: 5 mg via INTRAVENOUS

## 2021-05-03 MED ORDER — DEXAMETHASONE SODIUM PHOSPHATE 4 MG/ML IJ SOLN: INTRAMUSCULAR | Status: DC | PRN

## 2021-05-03 MED ORDER — DIPHENHYDRAMINE HCL 50 MG/ML IJ SOLN: 12.5000 mg | INTRAMUSCULAR | Status: DC | PRN

## 2021-05-03 MED ORDER — ONDANSETRON HCL 4 MG/2ML IJ SOLN: 4.0000 mg | Freq: Once | INTRAMUSCULAR | Status: DC | PRN

## 2021-05-03 MED ORDER — MORPHINE SULFATE (PF) 0.5 MG/ML IJ SOLN
INTRAMUSCULAR | Status: AC
  Filled 2021-05-03: qty 10

## 2021-05-03 MED ORDER — NALOXONE HCL 4 MG/10ML IJ SOLN
1.0000 ug/kg/h | INTRAVENOUS | Status: DC | PRN
  Filled 2021-05-03: qty 5

## 2021-05-03 MED ORDER — DIPHENHYDRAMINE HCL 25 MG PO CAPS: 25.0000 mg | ORAL_CAPSULE | ORAL | Status: DC | PRN

## 2021-05-03 MED ORDER — IBUPROFEN 600 MG PO TABS
600.0000 mg | ORAL_TABLET | Freq: Four times a day (QID) | ORAL | Status: DC
  Administered 2021-05-03 – 2021-05-05 (×8): 600 mg via ORAL
  Filled 2021-05-03 (×8): qty 1

## 2021-05-03 MED ORDER — PHENYLEPHRINE HCL-NACL 20-0.9 MG/250ML-% IV SOLN
INTRAVENOUS | Status: AC
  Filled 2021-05-03: qty 250

## 2021-05-03 MED ORDER — OXYCODONE HCL 5 MG PO TABS
5.0000 mg | ORAL_TABLET | ORAL | Status: DC | PRN
  Administered 2021-05-04: 5 mg via ORAL
  Filled 2021-05-03: qty 1

## 2021-05-03 MED ORDER — NALOXONE HCL 0.4 MG/ML IJ SOLN: 0.4000 mg | INTRAMUSCULAR | Status: DC | PRN

## 2021-05-03 MED ORDER — ZOLPIDEM TARTRATE 5 MG PO TABS: 5.0000 mg | ORAL_TABLET | Freq: Every evening | ORAL | Status: DC | PRN

## 2021-05-03 MED ORDER — ACETAMINOPHEN 500 MG PO TABS
1000.0000 mg | ORAL_TABLET | Freq: Four times a day (QID) | ORAL | Status: DC
  Administered 2021-05-03 – 2021-05-05 (×7): 1000 mg via ORAL
  Filled 2021-05-03 (×7): qty 2

## 2021-05-03 MED ORDER — DIBUCAINE (PERIANAL) 1 % EX OINT: 1.0000 "application " | TOPICAL_OINTMENT | CUTANEOUS | Status: DC | PRN

## 2021-05-03 MED ORDER — CEFAZOLIN SODIUM-DEXTROSE 2-4 GM/100ML-% IV SOLN: 2.0000 g | INTRAVENOUS | Status: DC

## 2021-05-03 MED ORDER — FENTANYL CITRATE (PF) 100 MCG/2ML IJ SOLN
INTRAMUSCULAR | Status: DC | PRN
  Administered 2021-05-03: 15 ug via INTRATHECAL

## 2021-05-03 MED ORDER — DEXAMETHASONE SODIUM PHOSPHATE 10 MG/ML IJ SOLN
INTRAMUSCULAR | Status: AC
  Filled 2021-05-03: qty 1

## 2021-05-03 MED ORDER — OXYTOCIN-SODIUM CHLORIDE 30-0.9 UT/500ML-% IV SOLN
INTRAVENOUS | Status: AC
  Filled 2021-05-03: qty 500

## 2021-05-03 MED ORDER — ONDANSETRON HCL 4 MG/2ML IJ SOLN
INTRAMUSCULAR | Status: DC | PRN
  Administered 2021-05-03: 4 mg via INTRAVENOUS

## 2021-05-03 MED ORDER — OXYTOCIN-SODIUM CHLORIDE 30-0.9 UT/500ML-% IV SOLN
INTRAVENOUS | Status: DC | PRN
  Administered 2021-05-03: 150 mL via INTRAVENOUS
  Administered 2021-05-03: 100 mL via INTRAVENOUS

## 2021-05-03 MED ORDER — OXYCODONE HCL 5 MG/5ML PO SOLN: 5.0000 mg | Freq: Once | ORAL | Status: DC | PRN

## 2021-05-03 MED ORDER — SODIUM CHLORIDE 0.9% FLUSH: 3.0000 mL | INTRAVENOUS | Status: DC | PRN

## 2021-05-03 MED ORDER — FENTANYL CITRATE (PF) 100 MCG/2ML IJ SOLN
INTRAMUSCULAR | Status: AC
  Filled 2021-05-03: qty 2

## 2021-05-03 MED ORDER — HYDROMORPHONE HCL 1 MG/ML IJ SOLN: 0.2500 mg | INTRAMUSCULAR | Status: DC | PRN

## 2021-05-03 MED ORDER — DEXAMETHASONE SODIUM PHOSPHATE 4 MG/ML IJ SOLN
INTRAMUSCULAR | Status: AC
  Filled 2021-05-03: qty 1

## 2021-05-03 MED ORDER — PHENYLEPHRINE HCL-NACL 20-0.9 MG/250ML-% IV SOLN
INTRAVENOUS | Status: DC | PRN
  Administered 2021-05-03: 60 ug/min via INTRAVENOUS

## 2021-05-03 MED ORDER — OXYCODONE HCL 5 MG PO TABS: 5.0000 mg | ORAL_TABLET | Freq: Once | ORAL | Status: DC | PRN

## 2021-05-03 MED ORDER — WITCH HAZEL-GLYCERIN EX PADS: 1.0000 "application " | MEDICATED_PAD | CUTANEOUS | Status: DC | PRN

## 2021-05-03 MED ORDER — CEFAZOLIN SODIUM-DEXTROSE 2-4 GM/100ML-% IV SOLN
INTRAVENOUS | Status: AC
  Filled 2021-05-03: qty 100

## 2021-05-03 MED ORDER — BUPIVACAINE IN DEXTROSE 0.75-8.25 % IT SOLN
INTRATHECAL | Status: DC | PRN
  Administered 2021-05-03: 12 mg via INTRATHECAL

## 2021-05-03 MED ORDER — NALBUPHINE HCL 10 MG/ML IJ SOLN: 5.0000 mg | INTRAMUSCULAR | Status: DC | PRN

## 2021-05-03 MED ORDER — PRENATAL MULTIVITAMIN CH
1.0000 | ORAL_TABLET | Freq: Every day | ORAL | Status: DC
  Administered 2021-05-03 – 2021-05-04 (×2): 1 via ORAL
  Filled 2021-05-03 (×2): qty 1

## 2021-05-03 SURGICAL SUPPLY — 34 items
APL SKNCLS STERI-STRIP NONHPOA (GAUZE/BANDAGES/DRESSINGS) ×1
BENZOIN TINCTURE PRP APPL 2/3 (GAUZE/BANDAGES/DRESSINGS) ×1 IMPLANT
CLOTH BEACON ORANGE TIMEOUT ST (SAFETY) ×2 IMPLANT
CLSR STERI-STRIP ANTIMIC 1/2X4 (GAUZE/BANDAGES/DRESSINGS) ×1 IMPLANT
DRSG OPSITE POSTOP 4X10 (GAUZE/BANDAGES/DRESSINGS) ×2 IMPLANT
ELECT REM PT RETURN 9FT ADLT (ELECTROSURGICAL) ×2
ELECTRODE REM PT RTRN 9FT ADLT (ELECTROSURGICAL) ×1 IMPLANT
EXTRACTOR VACUUM KIWI (MISCELLANEOUS) IMPLANT
GLOVE BIO SURGEON STRL SZ 6.5 (GLOVE) ×2 IMPLANT
GLOVE BIOGEL PI IND STRL 7.0 (GLOVE) ×1 IMPLANT
GLOVE BIOGEL PI INDICATOR 7.0 (GLOVE) ×1
GOWN STRL REUS W/TWL LRG LVL3 (GOWN DISPOSABLE) ×4 IMPLANT
KIT ABG SYR 3ML LUER SLIP (SYRINGE) IMPLANT
NDL HYPO 25X5/8 SAFETYGLIDE (NEEDLE) IMPLANT
NEEDLE HYPO 25X5/8 SAFETYGLIDE (NEEDLE) IMPLANT
NS IRRIG 1000ML POUR BTL (IV SOLUTION) ×2 IMPLANT
PACK C SECTION WH (CUSTOM PROCEDURE TRAY) ×2 IMPLANT
PAD OB MATERNITY 4.3X12.25 (PERSONAL CARE ITEMS) ×2 IMPLANT
PENCIL SMOKE EVAC W/HOLSTER (ELECTROSURGICAL) ×2 IMPLANT
RTRCTR C-SECT PINK 25CM LRG (MISCELLANEOUS) ×2 IMPLANT
STRIP CLOSURE SKIN 1/2X4 (GAUZE/BANDAGES/DRESSINGS) IMPLANT
SUT CHROMIC 1 CTX 36 (SUTURE) ×4 IMPLANT
SUT PLAIN 0 NONE (SUTURE) IMPLANT
SUT PLAIN 2 0 XLH (SUTURE) ×2 IMPLANT
SUT VIC AB 0 CT1 27 (SUTURE) ×4
SUT VIC AB 0 CT1 27XBRD ANBCTR (SUTURE) ×2 IMPLANT
SUT VIC AB 2-0 CT1 27 (SUTURE) ×2
SUT VIC AB 2-0 CT1 TAPERPNT 27 (SUTURE) ×1 IMPLANT
SUT VIC AB 3-0 CT1 27 (SUTURE) ×2
SUT VIC AB 3-0 CT1 TAPERPNT 27 (SUTURE) ×1 IMPLANT
SUT VIC AB 4-0 KS 27 (SUTURE) ×2 IMPLANT
TOWEL OR 17X24 6PK STRL BLUE (TOWEL DISPOSABLE) ×2 IMPLANT
TRAY FOLEY W/BAG SLVR 14FR LF (SET/KITS/TRAYS/PACK) ×2 IMPLANT
WATER STERILE IRR 1000ML POUR (IV SOLUTION) ×2 IMPLANT

## 2021-05-03 NOTE — Transfer of Care (Signed)
Immediate Anesthesia Transfer of Care Note  Patient: Kaylee Brown  Procedure(s) Performed: CESAREAN SECTION (Bilateral)  Patient Location: PACU  Anesthesia Type:Spinal  Level of Consciousness: awake, alert  and oriented  Airway & Oxygen Therapy: Patient Spontanous Breathing  Post-op Assessment: Report given to RN and Post -op Vital signs reviewed and stable  Post vital signs: Reviewed and stable  Last Vitals:  Vitals Value Taken Time  BP 84/74 05/03/21 0845  Temp 36.7 C 05/03/21 0845  Pulse 70 05/03/21 0852  Resp 15 05/03/21 0852  SpO2 96 % 05/03/21 0852  Vitals shown include unvalidated device data.  Last Pain:  Vitals:   05/03/21 0845  TempSrc: Oral  PainSc: 0-No pain         Complications: No notable events documented.

## 2021-05-03 NOTE — Anesthesia Procedure Notes (Signed)
Spinal  Patient location during procedure: OB Start time: 05/03/2021 7:23 AM End time: 05/03/2021 7:27 AM Reason for block: surgical anesthesia Staffing Performed: anesthesiologist  Anesthesiologist: Trevor Iha, MD Preanesthetic Checklist Completed: patient identified, IV checked, risks and benefits discussed, surgical consent, monitors and equipment checked, pre-op evaluation and timeout performed Spinal Block Patient position: sitting Prep: DuraPrep and site prepped and draped Patient monitoring: heart rate, cardiac monitor, continuous pulse ox and blood pressure Approach: midline Location: L3-4 Injection technique: single-shot Needle Needle type: Pencan  Needle gauge: 24 G Needle length: 10 cm Needle insertion depth: 6 cm Assessment Sensory level: T4 Events: CSF return Additional Notes  1 Attempt (s). Pt tolerated procedure well.

## 2021-05-03 NOTE — Op Note (Signed)
Operative Note    Preoperative Diagnosis Term pregnancy at 39 weeks Prior c-section, declines trial of labor  Postoperative Diagnosis same  Procedure Repeat low transverse c-section with two layer closure of uterus  Surgeon Huel Cote, MD  Anesthesia Spinal  Fluids: EBL UOP clear IVF LR  Findings A viable female infant in vertex presentation Apgars 8,9 weight 8lb 9oz Placenta slightly adherent to anterior left fundus, otherwise WNL.  Ovaries and tubes WNL  Specimen Placenta to L&D   Procedure Note  Patient was taken to the operating room where spinal anesthesia was obtained and found to be adequate by Allis clamp test. She was prepped and draped in the normal sterile fashion in the dorsal supine position with a leftward tilt. An appropriate time out was performed. A Pfannenstiel skin incision was then made through a pre-existing scar with the scalpel and carried through to the underlying layer of fascia by sharp dissection and Bovie cautery. The fascia was nicked in the midline and the incision was extended laterally with Mayo scissors. The inferior aspect of the incision was grasped Coker clamps and dissected off the underlying rectus muscles. In a similar fashion the superior aspect was dissected off the rectus muscles. Rectus muscles were separated in the midline and the peritoneal cavity entered bluntly. The peritoneal incision was then extended both superiorly and inferiorly with careful attention to avoid both bowel and bladder. The Alexis self-retaining wound retractor was then placed within the incision and the lower uterine segment exposed. The bladder flap was developed with Metzenbaum scissors and pushed away from the lower uterine segment. The lower uterine segment was then incised in a transverse fashion and the cavity itself entered bluntly. The incision was extended bluntly. The infant's head was then lifted and delivered from the incision  without difficulty. The remainder of the infant delivered and the nose and mouth bulb suctioned with the cord clamped and cut as well. The infant was handed off to the waiting pediatricians. The placenta was then attempted to be spontaneously expressed from the uterus, but was slightly adherent to anterior left fundus, but was able to be manually removed with no obvious retained tissue.  The myometrium felt slightly disrupted but was not bleeding. The uterus cleared of all clots and debris with moist lap sponge. The uterine incision was then repaired in 2 layers the first layer was a running locked layer of 1-0 chromic and the second an imbricating layer of the same suture. The tubes and ovaries were inspected and the gutters cleared of all clots and debris. The uterine incision was inspected and found to be hemostatic. All instruments and sponges as well as the Alexis retractor were then removed from the abdomen. The rectus muscles and peritoneum were then reapproximated with a running suture of 2-0 Vicryl. The fascia was then closed with 0 Vicryl in a running fashion. Subcutaneous tissue was reapproximated with 3-0 plain in a running fashion. The skin was closed with a subcuticular stitch of 4-0 Vicryl on a Keith needle and then reinforced with benzoin and Steri-Strips. At the conclusion of the procedure all instruments and sponge counts were correct. Patient was taken to the recovery room in good condition with her baby accompanying her skin to skin.

## 2021-05-03 NOTE — Lactation Note (Signed)
This note was copied from a baby's chart. Lactation Consultation Note  Patient Name: Kaylee Brown [id number] Date: 05/03/2021 Reason for consult: Follow-up assessment;Mother's request;Breastfeeding assistance Age:34 hours  Mom called for latch assistance. Mom nipples are round with no signs of compression or trauma. LC assisted with rolled blanket under breast and latching infant in football with audible swallows heard.  All questions answered at the end of the visit.   Maternal Data    Feeding Mother's Current Feeding Choice: Breast Milk  LATCH Score Latch: Repeated attempts needed to sustain latch, nipple held in mouth throughout feeding, stimulation needed to elicit sucking reflex.  Audible Swallowing: Spontaneous and intermittent  Type of Nipple: Everted at rest and after stimulation  Comfort (Breast/Nipple): Soft / non-tender  Hold (Positioning): Assistance needed to correctly position infant at breast and maintain latch.  LATCH Score: 8   Lactation Tools Discussed/Used    Interventions Interventions: Breast feeding basics reviewed;Support pillows;Education;Assisted with latch;Skin to skin;Expressed milk;Breast compression;Adjust position  Discharge    Consult Status Consult Status: Follow-up Date: 05/04/21 Follow-up type: In-patient    Amore Ackman  Nicholson-Springer 05/03/2021, 6:47 PM

## 2021-05-03 NOTE — Anesthesia Postprocedure Evaluation (Signed)
Anesthesia Post Note  Patient: Kaylee Brown  Procedure(s) Performed: CESAREAN SECTION (Bilateral)     Patient location during evaluation: Mother Baby Anesthesia Type: Spinal Level of consciousness: oriented and awake and alert Pain management: pain level controlled Vital Signs Assessment: post-procedure vital signs reviewed and stable Respiratory status: spontaneous breathing and respiratory function stable Cardiovascular status: blood pressure returned to baseline and stable Postop Assessment: no headache, no backache, no apparent nausea or vomiting and able to ambulate Anesthetic complications: no   No notable events documented.  Last Vitals:  Vitals:   05/03/21 1015 05/03/21 1115  BP: 112/74 110/70  Pulse: (!) 50 (!) 59  Resp: 16 16  Temp: 36.6 C (!) 36.3 C  SpO2: 99% 98%    Last Pain:  Vitals:   05/03/21 1115  TempSrc: Oral  PainSc:    Pain Goal:                   Trevor Iha

## 2021-05-03 NOTE — Interval H&P Note (Signed)
History and Physical Interval Note:  05/03/2021 7:00 AM  Kaylee Brown  has presented today for surgery, with the diagnosis of repeat c-section.  The various methods of treatment have been discussed with the patient and family. After consideration of risks, benefits and other options for treatment, the patient has consented to  Procedure(s): CESAREAN SECTION WITH BILATERAL TUBAL LIGATION (Bilateral) as a surgical intervention.  The patient's history has been reviewed, patient examined, no change in status, stable for surgery.  I have reviewed the patient's chart and labs.  Questions were answered to the patient's satisfaction.     Oliver Pila

## 2021-05-03 NOTE — Lactation Note (Signed)
This note was copied from a baby's chart. Lactation Consultation Note  Patient Name: Kaylee Brown OINOM'V Date: 05/03/2021 Reason for consult: Initial assessment;Term;1st time breastfeeding Age:34 hours   P2 mother whose infant is now 5 hours old.  This is a term baby at 39+0 weeks.  Mother did not breast feed her first child (now 10 months old).  Mother had just gotten baby latched when I arrived.  Assisted with repositioning and showed mother how to support her breast during feedings.  Observed baby  Kaylee Brown" feeding for 15 minutes with an occasional swallow.  Mother was able to demonstrate hand expression. Breast feeding basics reviewed.  Mother reported "Kaylee Brown" has been feeding a lot since birth.  Encouraged to feed 8-12 times/24 hours or sooner if baby shows feeding cues.  Suggested she call her RN/LC for latch assistance as needed.    Mom made aware of O/P services, breastfeeding support groups, community resources, and our phone # for post-discharge questions.  Mother has a DEBP for home use.  Father present.  Explained how father can assist with breast feeding.     Maternal Data Has patient been taught Hand Expression?: Yes Does the patient have breastfeeding experience prior to this delivery?: No (Attempted but not successful)  Feeding Mother's Current Feeding Choice: Breast Milk  LATCH Score Latch: Grasps breast easily, tongue down, lips flanged, rhythmical sucking.  Audible Swallowing: A few with stimulation  Type of Nipple: Everted at rest and after stimulation  Comfort (Breast/Nipple): Soft / non-tender  Hold (Positioning): Assistance needed to correctly position infant at breast and maintain latch.  LATCH Score: 8   Lactation Tools Discussed/Used Tools: Coconut oil  Interventions Interventions: Breast feeding basics reviewed;Assisted with latch;Skin to skin;Breast massage;Hand express;Breast compression;Coconut oil;Position options;Support  pillows;Adjust position;Education;LC Services brochure  Discharge Pump: Personal WIC Program: No  Consult Status Consult Status: Follow-up Date: 05/04/21 Follow-up type: In-patient    Kaylee Brown 05/03/2021, 1:12 PM

## 2021-05-03 NOTE — Progress Notes (Signed)
Patient ID: Kaylee Brown, female   DOB: August 23, 1985, 35 y.o.   MRN: [id number] POD#1  Pt doing well postoperatively with no complaints.   Routine pp/post op care

## 2021-05-04 ENCOUNTER — Encounter (HOSPITAL_COMMUNITY): Payer: Self-pay | Admitting: Obstetrics and Gynecology

## 2021-05-04 LAB — CBC
HCT: 27.5 % — ABNORMAL LOW (ref 36.0–46.0)
Hemoglobin: 8.9 g/dL — ABNORMAL LOW (ref 12.0–15.0)
MCH: 28.4 pg (ref 26.0–34.0)
MCHC: 32.4 g/dL (ref 30.0–36.0)
MCV: 87.9 fL (ref 80.0–100.0)
Platelets: 234 10*3/uL (ref 150–400)
RBC: 3.13 MIL/uL — ABNORMAL LOW (ref 3.87–5.11)
RDW: 13.1 % (ref 11.5–15.5)
WBC: 11.1 10*3/uL — ABNORMAL HIGH (ref 4.0–10.5)
nRBC: 0 % (ref 0.0–0.2)

## 2021-05-04 LAB — BIRTH TISSUE RECOVERY COLLECTION (PLACENTA DONATION)

## 2021-05-04 MED ORDER — FERROUS SULFATE 325 (65 FE) MG PO TABS
325.0000 mg | ORAL_TABLET | Freq: Every day | ORAL | Status: DC
  Administered 2021-05-05: 325 mg via ORAL
  Filled 2021-05-04: qty 1

## 2021-05-04 NOTE — Lactation Note (Signed)
This note was copied from a baby's chart. Lactation Consultation Note  Patient Name: Girl Aliah Eriksson CVELF'Y Date: 05/04/2021 Reason for consult: Follow-up assessment Age:34 hours  LC in to room for follow up. Mother states infant has been feeding well and her milk is transitioning. Mother reports nipples are sore.   Encouraged mother to used EBM to nipples, air-dry and use comfort gels for healing purposes.   Plan: 1-Feeding on demand or 8-12 times in 24h period. 2-EBM to nipples, air-dry and use comfort gels/coconut oil  4-Encouraged maternal rest, hydration and food intake.   Contact LC as needed for feeds/support.   Feeding Mother's Current Feeding Choice: Breast Milk  Interventions Interventions: Expressed milk;Coconut oil;Education;Comfort gels;Hand express;Breast feeding basics reviewed  Discharge Discharge Education: Engorgement and breast care  Consult Status Consult Status: Follow-up Date: 05/05/21 Follow-up type: In-patient    Briyonna Omara A Higuera Ancidey 05/04/2021, 9:29 PM

## 2021-05-04 NOTE — Progress Notes (Signed)
Patient is eating, ambulating, has not yet voided.  Pain control is good.  + flatus, appropriate lochia, no complaints.  Vitals:   05/03/21 1454 05/03/21 2015 05/03/21 2300 05/04/21 0420  BP: 121/71 116/67 117/66 122/73  Pulse: 76 81 77 73  Resp: 16 17 18 17   Temp:  98.5 F (36.9 C) 98.2 F (36.8 C) 98.3 F (36.8 C)  TempSrc:  Oral Oral Oral  SpO2: 97% 99% 99% 98%  Weight:      Height:        Fundus firm Inc: c/d/I Ext: no calf tenderness  Lab Results  Component Value Date   WBC 11.1 (H) 05/04/2021   HGB 8.9 (L) 05/04/2021   HCT 27.5 (L) 05/04/2021   MCV 87.9 05/04/2021   PLT 234 05/04/2021    --/--/A POS (10/24 0911)  A/P Post op day #1 s/p R C/S. Doing well, baby doing well Acute blood loss anemia: added FeSO4 once daily to PNV  Routine care.  Expect d/c 10/27    11/27

## 2021-05-05 DIAGNOSIS — D649 Anemia, unspecified: Secondary | ICD-10-CM | POA: Diagnosis present

## 2021-05-05 MED ORDER — ACETAMINOPHEN 500 MG PO TABS: 1000.0000 mg | ORAL_TABLET | Freq: Four times a day (QID) | ORAL | 0 refills | Status: AC

## 2021-05-05 MED ORDER — IBUPROFEN 600 MG PO TABS: 600.0000 mg | ORAL_TABLET | Freq: Four times a day (QID) | ORAL | 0 refills | Status: AC

## 2021-05-05 NOTE — Discharge Summary (Signed)
Postpartum Discharge Summary       Patient Name: Kaylee Brown DOB: 04-Feb-1986 MRN: [id number]  Date of admission: 05/03/2021 Delivery date:05/03/2021  Delivering provider: Huel Cote  Date of discharge: 05/05/2021  Admitting diagnosis: S/P repeat low transverse C-section [Z98.891] Intrauterine pregnancy: [redacted]w[redacted]d     Secondary diagnosis:  Active Problems:   S/P repeat low transverse C-section   Chronic anemia  Additional problems: none    Discharge diagnosis: Term Pregnancy Delivered                                              Post partum procedures: none Augmentation: N/A Complications: None  Hospital course: Sceduled C/S   34 y.o. yo [id number] at [redacted]w[redacted]d was admitted to the hospital 05/03/2021 for scheduled cesarean section with the following indication:Elective Repeat.Delivery details are as follows:  Membrane Rupture Time/Date: 7:50 AM ,05/03/2021   Delivery Method:C-Section, Low Transverse  Details of operation can be found in separate operative note.  Patient had an uncomplicated postpartum course.  She is ambulating, tolerating a regular diet, passing flatus, and urinating well. Patient is discharged home in stable condition on  05/05/21        Newborn Data: Birth date:05/03/2021  Birth time:7:51 AM  Gender:Female  Living status:Living  Apgars:8 ,9  Weight:3880 g     Magnesium Sulfate received: No BMZ received: No Rhophylac:N/A M  Physical exam  Vitals:   05/04/21 0420 05/04/21 1413 05/04/21 1954 05/05/21 0544  BP: 122/73 118/75 124/67 127/71  Pulse: 73 62 70 80  Resp: 17 20 19 18   Temp: 98.3 F (36.8 C) 97.8 F (36.6 C) 97.8 F (36.6 C) 98.3 F (36.8 C)  TempSrc: Oral Oral Oral Oral  SpO2: 98%  100% 100%  Weight:      Height:       General: alert and cooperative Lochia: appropriate Uterine Fundus: firm Incision: Dressing is clean, dry, and intact DVT Evaluation: No evidence of DVT seen on physical exam. Labs: Lab Results  Component  Value Date   WBC 11.1 (H) 05/04/2021   HGB 8.9 (L) 05/04/2021   HCT 27.5 (L) 05/04/2021   MCV 87.9 05/04/2021   PLT 234 05/04/2021   CMP Latest Ref Rng & Units 10/24/2019  Glucose 70 - 99 mg/dL 91  BUN 6 - 20 mg/dL 14  Creatinine 10/26/2019 - 0.73 mg/dL 7.10  Sodium 6.26 - 948 mmol/L 137  Potassium 3.5 - 5.1 mmol/L 3.7  Chloride 98 - 111 mmol/L 107  CO2 22 - 32 mmol/L 19(L)  Calcium 8.9 - 10.3 mg/dL 9.2  Total Protein 6.5 - 8.1 g/dL 6.4(L)  Total Bilirubin 0.3 - 1.2 mg/dL 546)  Alkaline Phos 38 - 126 U/L 94  AST 15 - 41 U/L 18  ALT 0 - 44 U/L 14   Edinburgh Score: Edinburgh Postnatal Depression Scale Screening Tool 05/04/2021  I have been able to laugh and see the funny side of things. 0  I have looked forward with enjoyment to things. 0  I have blamed myself unnecessarily when things went wrong. 0  I have been anxious or worried for no good reason. 0  I have felt scared or panicky for no good reason. 0  Things have been getting on top of me. 0  I have been so unhappy that I have had difficulty sleeping. 0  I have felt  sad or miserable. 0  I have been so unhappy that I have been crying. 0  The thought of harming myself has occurred to me. 0  Edinburgh Postnatal Depression Scale Total 0     After visit meds:  Allergies as of 05/05/2021   No Known Allergies      Medication List     TAKE these medications    acetaminophen 500 MG tablet Commonly known as: TYLENOL Take 2 tablets (1,000 mg total) by mouth every 6 (six) hours.   butalbital-acetaminophen-caffeine 50-325-40 MG tablet Commonly known as: FIORICET Take 1 tablet by mouth every 4 (four) hours as needed for headache.   ibuprofen 600 MG tablet Commonly known as: ADVIL Take 1 tablet (600 mg total) by mouth every 6 (six) hours.         Discharge home in stable condition Infant Feeding: Breast Infant Disposition:home with mother Discharge instruction: per After Visit Summary and Postpartum  booklet. Activity: Advance as tolerated. Pelvic rest for 6 weeks.  Diet: routine diet Future Appointments:No future appointments. Follow up Visit:  Follow-up Information     Huel Cote, MD. Schedule an appointment as soon as possible for a visit in 2 week(s).   Specialty: Obstetrics and Gynecology Why: Incision check Contact information: [address] [place] [address] [phone]                  Please schedule this patient for a In person postpartum visit in  2 weeks  with the following provider: MD.  Delivery mode:  C-Section, Low Transverse  Anticipated Birth Control:  Unsure   05/05/2021 Oliver Pila, MD

## 2021-05-05 NOTE — Progress Notes (Signed)
Subjective: Postpartum Day 2: Cesarean Delivery Patient reports incisional pain, tolerating PO, + flatus, and no problems voiding.  Ambulating without problem  Objective: Vital signs in last 24 hours: Temp:  [97.8 F (36.6 C)-98.3 F (36.8 C)] 98.3 F (36.8 C) (10/27 0544) Pulse Rate:  [62-80] 80 (10/27 0544) Resp:  [18-20] 18 (10/27 0544) BP: (118-127)/(67-75) 127/71 (10/27 0544) SpO2:  [100 %] 100 % (10/27 0544)  Physical Exam:  General: alert and cooperative Lochia: appropriate Uterine Fundus: firm Incision: C/D/I   Recent Labs    05/04/21 0445  HGB 8.9*  HCT 27.5*    Assessment/Plan: Status post Cesarean section. Doing well postoperatively.  Discharge home with standard precautions and return to clinic in 2 weeks. Pt with chronic anemia and minimal drop from c-section. No symptoms.  Has iron to take at home and plans to get in diet as well.   Kaylee Brown 05/05/2021, 9:15 AM

## 2021-05-05 NOTE — Lactation Note (Signed)
This note was copied from a baby's chart. Lactation Consultation Note  Patient Name: Kaylee Brown [id number] Date: 05/05/2021   Age:34 hours  Mom reports discomfort with nursing. Her L nipple looks intact; her R nipple is cracked. Infant had recently finished feeding from the L breast, but soon began cueing. I assisted with latching to the R breast, but Mom had intense discomfort and wanted infant removed from the breast. As infant was still cueing to be fed, I suggested supplementing with DBM or formula. Mom chose DBM. Paced bottle-feeding was taught to parents and infant did well.  Mom reported that her breasts felt heavier to her today, but palpated soft to me. Mom has a hx of low milk supply. She lactated & pumped for 6 weeks with her 1st child & could never express more than 1 oz total. Mom has a Medela pump at home. I encouraged Mom to pump if additional supplementing is needed once at home.   Parents know how to reach [place] for post-discharge questions.   Lurline Hare [facility] 05/05/2021, 10:55 AM

## 2021-05-14 ENCOUNTER — Telehealth (HOSPITAL_COMMUNITY): Payer: Self-pay

## 2021-05-14 NOTE — Telephone Encounter (Signed)
"  Doing pretty goog." Patient has no questions or concerns about her healing.  "Baby is doing good. Baby up to birthweight and eating well. I'm trying to work on getting my milk supply up." RN reviewed LC resources at [facility]." The cord fell off. Baby is doing good. Baby sleeps in a bassinet."  RN reviewed ABC's of safe sleep with patient. Patient declines any questions or concerns about baby.  EPDS score is 2.  Marcelino Duster [facility] 05/14/2021,1350

## 2021-05-18 DIAGNOSIS — R309 Painful micturition, unspecified: Secondary | ICD-10-CM | POA: Diagnosis not present

## 2021-06-10 DIAGNOSIS — Z1389 Encounter for screening for other disorder: Secondary | ICD-10-CM | POA: Diagnosis not present

## 2021-06-10 DIAGNOSIS — Z3009 Encounter for other general counseling and advice on contraception: Secondary | ICD-10-CM | POA: Diagnosis not present

## 2021-06-10 DIAGNOSIS — Z30011 Encounter for initial prescription of contraceptive pills: Secondary | ICD-10-CM | POA: Diagnosis not present

## 2021-09-27 ENCOUNTER — Emergency Department (HOSPITAL_BASED_OUTPATIENT_CLINIC_OR_DEPARTMENT_OTHER): Payer: BC Managed Care – PPO

## 2021-09-27 ENCOUNTER — Encounter (HOSPITAL_BASED_OUTPATIENT_CLINIC_OR_DEPARTMENT_OTHER): Payer: Self-pay | Admitting: Emergency Medicine

## 2021-09-27 ENCOUNTER — Other Ambulatory Visit: Payer: Self-pay

## 2021-09-27 ENCOUNTER — Emergency Department (HOSPITAL_BASED_OUTPATIENT_CLINIC_OR_DEPARTMENT_OTHER)
Admission: EM | Admit: 2021-09-27 | Discharge: 2021-09-27 | Disposition: A | Discharge status: Home / Self Care | Payer: BC Managed Care – PPO | Attending: Emergency Medicine | Admitting: Emergency Medicine

## 2021-09-27 ENCOUNTER — Emergency Department (HOSPITAL_BASED_OUTPATIENT_CLINIC_OR_DEPARTMENT_OTHER): Payer: BC Managed Care – PPO | Admitting: Radiology

## 2021-09-27 DIAGNOSIS — R0789 Other chest pain: Secondary | ICD-10-CM | POA: Insufficient documentation

## 2021-09-27 DIAGNOSIS — M79604 Pain in right leg: Secondary | ICD-10-CM | POA: Diagnosis not present

## 2021-09-27 DIAGNOSIS — R0602 Shortness of breath: Secondary | ICD-10-CM | POA: Diagnosis not present

## 2021-09-27 DIAGNOSIS — M7989 Other specified soft tissue disorders: Secondary | ICD-10-CM | POA: Insufficient documentation

## 2021-09-27 DIAGNOSIS — R2240 Localized swelling, mass and lump, unspecified lower limb: Secondary | ICD-10-CM | POA: Diagnosis not present

## 2021-09-27 DIAGNOSIS — M79661 Pain in right lower leg: Secondary | ICD-10-CM | POA: Diagnosis not present

## 2021-09-27 DIAGNOSIS — R600 Localized edema: Secondary | ICD-10-CM | POA: Diagnosis not present

## 2021-09-27 LAB — CBC WITH DIFFERENTIAL/PLATELET
Abs Immature Granulocytes: 0.01 10*3/uL (ref 0.00–0.07)
Basophils Absolute: 0 10*3/uL (ref 0.0–0.1)
Basophils Relative: 1 %
Eosinophils Absolute: 0.1 10*3/uL (ref 0.0–0.5)
Eosinophils Relative: 2 %
HCT: 39.5 % (ref 36.0–46.0)
Hemoglobin: 12.7 g/dL (ref 12.0–15.0)
Immature Granulocytes: 0 %
Lymphocytes Relative: 36 %
Lymphs Abs: 2.2 10*3/uL (ref 0.7–4.0)
MCH: 28.6 pg (ref 26.0–34.0)
MCHC: 32.2 g/dL (ref 30.0–36.0)
MCV: 89 fL (ref 80.0–100.0)
Monocytes Absolute: 0.6 10*3/uL (ref 0.1–1.0)
Monocytes Relative: 10 %
Neutro Abs: 3.1 10*3/uL (ref 1.7–7.7)
Neutrophils Relative %: 51 %
Platelets: 275 10*3/uL (ref 150–400)
RBC: 4.44 MIL/uL (ref 3.87–5.11)
RDW: 13.3 % (ref 11.5–15.5)
WBC: 6 10*3/uL (ref 4.0–10.5)
nRBC: 0 % (ref 0.0–0.2)

## 2021-09-27 LAB — BASIC METABOLIC PANEL
Anion gap: 6 (ref 5–15)
BUN: 17 mg/dL (ref 6–20)
CO2: 27 mmol/L (ref 22–32)
Calcium: 9.1 mg/dL (ref 8.9–10.3)
Chloride: 106 mmol/L (ref 98–111)
Creatinine, Ser: 0.81 mg/dL (ref 0.44–1.00)
GFR, Estimated: >60 mL/min (ref >60)
Glucose, Bld: 85 mg/dL (ref 70–99)
Potassium: 3.6 mmol/L (ref 3.5–5.1)
Sodium: 139 mmol/L (ref 135–145)

## 2021-09-27 LAB — BRAIN NATRIURETIC PEPTIDE: B Natriuretic Peptide: 34.3 pg/mL (ref 0.0–100.0)

## 2021-09-27 LAB — TROPONIN I (HIGH SENSITIVITY): Troponin I (High Sensitivity): <2 ng/L (ref <18)

## 2021-09-27 LAB — D-DIMER, QUANTITATIVE: D-Dimer, Quant: 0.35 ug/mL-FEU (ref 0.00–0.50)

## 2021-09-27 NOTE — ED Provider Notes (Signed)
?[facility] EMERGENCY DEPT ?Provider Note ? ? ?CSN: [id number] ?Arrival date & time: 09/27/21  1818 ? ?  ? ?History ? ?Chief Complaint  ?Patient presents with  ? Shortness of Breath  ? ? ?Kaylee Brown is a 36 y.o. 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  Patient states that she was post to be taking a blood thinner for approximately 3 months leading up to the birth of her child but was not very compliant on this.  She is not currently on oral contraceptives.  She notes that swelling of the legs is worse after she is on her feet for a long time and she is also endorsing right posterior calf pain that radiate up the medial thigh.  Patient has seen a vein doctor in the past for her painful varicose veins but was unable to have the procedure to remove them as she became pregnant.  Patient has not seen her OB/GYN since her 6-week postpartum appointment.  She states that she came in today as the swelling just "keeps happening."  She denies abdominal pain, nausea, vomiting, diarrhea and palpitations.  History of DVT, no previous history of cardiac disease. ? ? ?Shortness of Breath ? ?  ? ?Home Medications ?Prior to Admission medications   ?Medication Sig Start Date End Date Taking? Authorizing Provider  ?acetaminophen (TYLENOL) 500 MG tablet Take 2 tablets (1,000 mg total) by mouth every 6 (six) hours. 05/05/21   Huel Coteichardson, Kathy, MD  ?butalbital-acetaminophen-caffeine [facility](FIORICET) [phone] MG tablet Take 1 tablet by mouth every 4 (four) hours as needed for headache. 03/23/21   [provider]  ?ibuprofen (ADVIL) 600 MG tablet Take 1 tablet (600 mg total) by mouth every 6 (six) hours. 05/05/21   Huel Coteichardson, Kathy, MD  ?   ? ?Allergies    ?Patient has no  known allergies.   ? ?Review of Systems   ?Review of Systems  ?Respiratory:  Positive for shortness of breath.   ? ?Physical Exam ?Updated Vital Signs ?BP 104/72   Pulse 72   Temp 98.3 ?F (36.8 ?C)   Resp 18   Ht 5\' 7"  (1.702 m)   Wt 102.1 kg   LMP 08/26/2021 (Approximate)   SpO2 100%   BMI 35.24 kg/m?  ?Physical Exam ?Vitals and nursing note reviewed.  ?Constitutional:   ?   General: She is not in acute distress. ?   Appearance: She is not ill-appearing.  ?HENT:  ?   Head: Atraumatic.  ?Eyes:  ?   Conjunctiva/sclera: Conjunctivae normal.  ?Cardiovascular:  ?   Rate and Rhythm: Normal rate and regular rhythm.  ?   Pulses: Normal pulses.     ?     Radial pulses are 2+ on the right side and 2+ on the left side.  ?     Dorsalis pedis pulses are 2+ on the right side and 2+ on the left side.  ?   Heart sounds: No murmur heard. ?Pulmonary:  ?   Effort: Pulmonary effort is normal. No respiratory distress.  ?   Breath sounds: Normal breath sounds.  ?Abdominal:  ?   General: Abdomen is flat. There is no distension.  ?   Palpations: Abdomen is soft.  ?   Tenderness: There is no abdominal tenderness.  ?  Musculoskeletal:     ?   General: Normal range of motion.  ?   Cervical back: Normal range of motion.  ?   Right lower leg: 2+ Edema present.  ?   Left lower leg: 2+ Edema present.  ?Skin: ?   General: Skin is warm and dry.  ?   Capillary Refill: Capillary refill takes less than 2 seconds.  ?Neurological:  ?   General: No focal deficit present.  ?   Mental Status: She is alert.  ?Psychiatric:     ?   Mood and Affect: Mood normal.  ? ? ?ED Results / Procedures / Treatments   ?Labs ?(all labs ordered are listed, but only abnormal results are displayed) ?Labs Reviewed  ?BASIC METABOLIC PANEL  ?CBC WITH DIFFERENTIAL/PLATELET  ?BRAIN NATRIURETIC PEPTIDE  ?D-DIMER, QUANTITATIVE  ?TROPONIN I (HIGH SENSITIVITY)  ?TROPONIN I (HIGH SENSITIVITY)  ? ? ?EKG ?EKG Interpretation ? ?Date/Time:  Tuesday September 27 2021 18:31:52  EDT ?Ventricular Rate:  65 ?PR Interval:  142 ?QRS Duration: 94 ?QT Interval:  398 ?QTC Calculation: 413 ?R Axis:   4 ?Text Interpretation: Normal sinus rhythm with sinus arrhythmia Normal ECG When compared with ECG of 23-Oct-2012 21:27, Similar to prior Confirmed by Alona Bene [id number]) on 09/27/2021 6:48:11 PM ? ?Radiology ?DG Chest 2 View ? ?Result Date: 09/27/2021 ?CLINICAL DATA:  chest tightness EXAM: CHEST - 2 VIEW COMPARISON:  Chest x-ray 11/01/2012 FINDINGS: The heart and mediastinal contours are within normal limits. No focal consolidation. No pulmonary edema. No pleural effusion. No pneumothorax. No acute osseous abnormality. IMPRESSION: No active cardiopulmonary disease. Electronically Signed   By: Tish Frederickson M.D.   On: 09/27/2021 21:10  ? ?US Venous Img Lower Unilateral Right (DVT) ? ?Result Date: 09/27/2021 ?CLINICAL DATA:  Right lower extremity pain and swelling EXAM: RIGHT LOWER EXTREMITY VENOUS DOPPLER ULTRASOUND TECHNIQUE: Gray-scale sonography with compression, as well as color and duplex ultrasound, were performed to evaluate the deep venous system(s) from the level of the common femoral vein through the popliteal and proximal calf veins. COMPARISON:  None. FINDINGS: VENOUS Normal compressibility of the common femoral, superficial femoral, and popliteal veins, as well as the visualized calf veins. Visualized portions of profunda femoral vein and great saphenous vein unremarkable. No filling defects to suggest DVT on grayscale or color Doppler imaging. Doppler waveforms show normal direction of venous flow, normal respiratory plasticity and response to augmentation. Limited views of the contralateral common femoral vein are unremarkable. OTHER Multiple superficial varicosities are noted within the right thigh. Limitations: none IMPRESSION: Negative. Electronically Signed   By: Helyn Numbers M.D.   On: 09/27/2021 20:41   ? ?Procedures ?Procedures  ? ? ?Medications Ordered in ED ?Medications - No  data to display ? ?ED Course/ Medical Decision Making/ A&P ?  ?                        ?Medical Decision Making ?Amount and/or Complexity of Data Reviewed ?Labs: ordered. ? ? ?History:  ?Per HPI ?Social determinants of health: none ? ?Initial impression: ? ?This patient presents to the ED for concern of bilateral leg swelling, this involves an extensive number of treatment options, and is a complaint that carries with it a high risk of complications and morbidity.    ?36 year old female 5 months postpartum who presents to the ED for evaluation of ongoing bilateral leg swelling and chest tightness since her C-section in October 2022.  Physical exam was significant for 2+  nonpitting edema bilaterally with right posterior calf tenderness to palpation.  Distal lower extremity pulses intact.  Lungs were CTA bilaterally, abdomen does not appear fluid overloaded.  Given her history of superficial blood clots, exam findings and noncompliance with her anticoagulant leading up to her birth, after which her symptoms started, I will obtain venous ultrasound imaging, BMP and basic labs for initial evaluation. ? ? ?Lab Tests and EKG: ? ?I Ordered, reviewed, and interpreted labs and EKG.  The pertinent results include:  ?EKG normal ?CBC, BMP, BNP, D-dimer and troponin all normal ? ? ?Imaging Studies ordered: ? ?I ordered imaging studies including  ?Chest x-ray without acute findings ?Venous ultrasound of right leg negative for DVT ?I independently visualized and interpreted imaging and I agree with the radiologist interpretation.  ? ? ?Cardiac Monitoring: ? ?The patient was maintained on a cardiac monitor.  I personally viewed and interpreted the cardiac monitored which showed an underlying rhythm of: NSR ? ? ? ?Disposition: ? ?After consideration of the diagnostic results, physical exam, history and the patients response to treatment feel that the patent would benefit from discharge.   ?Leg swelling: Patient's work-up was very  reassuring without any acute findings or causes of her bilateral leg swelling and right leg pain.  Patient was largely concerned about a blood clot given her history, however we able to safely rule this out given her norma

## 2021-09-27 NOTE — ED Triage Notes (Signed)
Pt had baby on October 25. Pt had trouble with clots in her right leg during her pregnancy. Pt states that she has continued to have trouble with a burning sensation in same leg and Pt feels that feels that she is retaining fluid in both legs. Pt reports tightness in her chest with exertion.  ?

## 2021-09-27 NOTE — Discharge Instructions (Signed)
Your labs imaging today all looked very good.  Your chest x-ray was normal, your imaging and labs were negative for a blood clot.  Your heart enzymes were normal.  It is likely that the swelling in your legs is secondary from venous insufficiency given your history of varicose veins.  I recommend following up with your doctor or with your pain doctor for further evaluation.  If your swelling becomes significantly worse or painful, you can use the prescription compression stockings given to you by the vein doctor.  When you are at home sitting or lying down, elevate your legs on pillows to help drain the fluid off of your legs. ?

## 2021-11-28 DIAGNOSIS — Z3046 Encounter for surveillance of implantable subdermal contraceptive: Secondary | ICD-10-CM | POA: Diagnosis not present

## 2022-01-27 ENCOUNTER — Encounter: Payer: Self-pay | Admitting: Physician Assistant

## 2022-01-27 ENCOUNTER — Telehealth: Payer: BC Managed Care – PPO | Admitting: Physician Assistant

## 2022-01-27 DIAGNOSIS — B9689 Other specified bacterial agents as the cause of diseases classified elsewhere: Secondary | ICD-10-CM

## 2022-01-27 DIAGNOSIS — J208 Acute bronchitis due to other specified organisms: Secondary | ICD-10-CM

## 2022-01-27 DIAGNOSIS — J019 Acute sinusitis, unspecified: Secondary | ICD-10-CM

## 2022-01-27 MED ORDER — BENZONATATE 100 MG PO CAPS: 100.0000 mg | ORAL_CAPSULE | Freq: Three times a day (TID) | ORAL | 0 refills | Status: AC | PRN

## 2022-01-27 MED ORDER — AMOXICILLIN-POT CLAVULANATE 875-125 MG PO TABS: 1.0000 | ORAL_TABLET | Freq: Two times a day (BID) | ORAL | 0 refills | Status: AC

## 2022-01-27 MED ORDER — IPRATROPIUM BROMIDE 0.03 % NA SOLN: 2.0000 | Freq: Two times a day (BID) | NASAL | 0 refills | Status: AC

## 2022-01-27 NOTE — Progress Notes (Signed)
Virtual Visit Consent   Kaylee Brown, you are scheduled for a virtual visit with a [facility] provider today. Just as with appointments in the office, your consent must be obtained to participate. Your consent will be active for this visit and any virtual visit you may have with one of our providers in the next 365 days. If you have a MyChart account, a copy of this consent can be sent to you electronically.  As this is a virtual visit, video technology does not allow for your provider to perform a traditional examination. This may limit your provider's ability to fully assess your condition. If your provider identifies any concerns that need to be evaluated in person or the need to arrange testing (such as labs, EKG, etc.), we will make arrangements to do so. Although advances in technology are sophisticated, we cannot ensure that it will always work on either your end or our end. If the connection with a video visit is poor, the visit may have to be switched to a telephone visit. With either a video or telephone visit, we are not always able to ensure that we have a secure connection.  By engaging in this virtual visit, you consent to the provision of healthcare and authorize for your insurance to be billed (if applicable) for the services provided during this visit. Depending on your insurance coverage, you may receive a charge related to this service.  I need to obtain your verbal consent now. Are you willing to proceed with your visit today? Kaylee Brown has provided verbal consent on 01/27/2022 for a virtual visit (video or telephone). Margaretann Loveless, PA-C  Date: 01/27/2022 8:50 AM  Virtual Visit via Video Note   I, Margaretann Loveless, connected with  Kaylee Brown  ([id number], 01/19/1986) on 01/27/22 at  8:45 AM EDT by a video-enabled telemedicine application and verified that I am speaking with the correct person using two identifiers.  Location: Patient: Virtual Visit  Location Patient: Home Provider: Virtual Visit Location Provider: Home Office   I discussed the limitations of evaluation and management by telemedicine and the availability of in person appointments. The patient expressed understanding and agreed to proceed.    History of Present Illness: Kaylee Brown is a 36 y.o. who identifies as a female who was assigned female at birth, and is being seen today for URI symptoms.  HPI: URI  This is a new problem. The current episode started in the past 7 days. The problem has been gradually worsening. There has been no fever. Associated symptoms include chest pain (chest burns, not pain), congestion, coughing, headaches, nausea, rhinorrhea, sinus pain, a sore throat and vomiting (mucous). Pertinent negatives include no plugged ear sensation. Treatments tried: Mucinex DM, delsym. The treatment provided no relief.      Problems:  Patient Active Problem List   Diagnosis Date Noted   Chronic anemia 05/05/2021   S/P repeat low transverse C-section 05/03/2021   History of migraine 10/13/2020   Status post primary low transverse cesarean section 10/26/2019   Gestational hypertension, third trimester 10/24/2019   Congenital septal defect of heart 03/04/2019   Irregular periods 07/27/2016    Allergies: No Known Allergies Medications:  Current Outpatient Medications:    amoxicillin-clavulanate (AUGMENTIN) 875-125 MG tablet, Take 1 tablet by mouth 2 (two) times daily., Disp: 20 tablet, Rfl: 0   benzonatate (TESSALON) 100 MG capsule, Take 1 capsule (100 mg total) by mouth 3 (three) times daily as needed., Disp: 30  capsule, Rfl: 0   ipratropium (ATROVENT) 0.03 % nasal spray, Place 2 sprays into both nostrils every 12 (twelve) hours., Disp: 30 mL, Rfl: 0   acetaminophen (TYLENOL) 500 MG tablet, Take 2 tablets (1,000 mg total) by mouth every 6 (six) hours., Disp: 30 tablet, Rfl: 0   butalbital-acetaminophen-caffeine (FIORICET) 50-325-40 MG tablet, Take 1  tablet by mouth every 4 (four) hours as needed for headache., Disp: , Rfl:    ibuprofen (ADVIL) 600 MG tablet, Take 1 tablet (600 mg total) by mouth every 6 (six) hours., Disp: 30 tablet, Rfl: 0  Observations/Objective: Patient is well-developed, well-nourished in no acute distress.  Resting comfortably at home.  Head is normocephalic, atraumatic.  No labored breathing.  Speech is clear and coherent with logical content.  Patient is alert and oriented at baseline.    Assessment and Plan: 1. Acute bacterial bronchitis - amoxicillin-clavulanate (AUGMENTIN) 875-125 MG tablet; Take 1 tablet by mouth 2 (two) times daily.  Dispense: 20 tablet; Refill: 0 - benzonatate (TESSALON) 100 MG capsule; Take 1 capsule (100 mg total) by mouth 3 (three) times daily as needed.  Dispense: 30 capsule; Refill: 0  2. Acute bacterial sinusitis - amoxicillin-clavulanate (AUGMENTIN) 875-125 MG tablet; Take 1 tablet by mouth 2 (two) times daily.  Dispense: 20 tablet; Refill: 0 - ipratropium (ATROVENT) 0.03 % nasal spray; Place 2 sprays into both nostrils every 12 (twelve) hours.  Dispense: 30 mL; Refill: 0  - Worsening symptoms that have not responded to OTC medications.  - Will give Augmentin, Ipratropium bromide, and tessalon perles - Mucinex or Delsym can be taken as well - Can use tylenol and/or ibuprofen as needed - Continue allergy medications.  - Steam and humidifier can help - Stay well hydrated and get plenty of rest.  - Seek in person evaluation if no symptom improvement or if symptoms worsen   Follow Up Instructions: I discussed the assessment and treatment plan with the patient. The patient was provided an opportunity to ask questions and all were answered. The patient agreed with the plan and demonstrated an understanding of the instructions.  A copy of instructions were sent to the patient via MyChart unless otherwise noted below.    The patient was advised to call back or seek an in-person  evaluation if the symptoms worsen or if the condition fails to improve as anticipated.  Time:  I spent 10 minutes with the patient via telehealth technology discussing the above problems/concerns.    Margaretann Loveless, PA-C

## 2022-01-27 NOTE — Patient Instructions (Signed)
Quillian Quince, thank you for joining Margaretann Loveless, PA-C for today's virtual visit.  While this provider is not your primary care provider (PCP), if your PCP is located in our provider database this encounter information will be shared with them immediately following your visit.  Consent: (Patient) Kaylee Brown provided verbal consent for this virtual visit at the beginning of the encounter.  Current Medications:  Current Outpatient Medications:    amoxicillin-clavulanate (AUGMENTIN) 875-125 MG tablet, Take 1 tablet by mouth 2 (two) times daily., Disp: 20 tablet, Rfl: 0   benzonatate (TESSALON) 100 MG capsule, Take 1 capsule (100 mg total) by mouth 3 (three) times daily as needed., Disp: 30 capsule, Rfl: 0   ipratropium (ATROVENT) 0.03 % nasal spray, Place 2 sprays into both nostrils every 12 (twelve) hours., Disp: 30 mL, Rfl: 0   acetaminophen (TYLENOL) 500 MG tablet, Take 2 tablets (1,000 mg total) by mouth every 6 (six) hours., Disp: 30 tablet, Rfl: 0   butalbital-acetaminophen-caffeine (FIORICET) 50-325-40 MG tablet, Take 1 tablet by mouth every 4 (four) hours as needed for headache., Disp: , Rfl:    ibuprofen (ADVIL) 600 MG tablet, Take 1 tablet (600 mg total) by mouth every 6 (six) hours., Disp: 30 tablet, Rfl: 0   Medications ordered in this encounter:  Meds ordered this encounter  Medications   amoxicillin-clavulanate (AUGMENTIN) 875-125 MG tablet    Sig: Take 1 tablet by mouth 2 (two) times daily.    Dispense:  20 tablet    Refill:  0    Order Specific Question:   Supervising Provider    Answer:   MILLER, BRIAN [[id number]]   benzonatate (TESSALON) 100 MG capsule    Sig: Take 1 capsule (100 mg total) by mouth 3 (three) times daily as needed.    Dispense:  30 capsule    Refill:  0    Order Specific Question:   Supervising Provider    Answer:   MILLER, BRIAN [[id number]]   ipratropium (ATROVENT) 0.03 % nasal spray    Sig: Place 2 sprays into both nostrils every 12 (twelve)  hours.    Dispense:  30 mL    Refill:  0    Order Specific Question:   Supervising Provider    Answer:   Hyacinth Meeker, BRIAN [[id number]]     *If you need refills on other medications prior to your next appointment, please contact your pharmacy*  Follow-Up: Call back or seek an in-person evaluation if the symptoms worsen or if the condition fails to improve as anticipated.  Other Instructions Upper Respiratory Infection, Adult An upper respiratory infection (URI) affects the nose, throat, and upper airways that lead to the lungs. The most common type of URI is often called the common cold. URIs usually get better on their own, without medical treatment. What are the causes? A URI is caused by a germ (virus). You may catch these germs by: Breathing in droplets from an infected person's cough or sneeze. Touching something that has the germ on it (is contaminated) and then touching your mouth, nose, or eyes. What increases the risk? You are more likely to get a URI if: You are very young or very old. You have close contact with others, such as at work, school, or a health care facility. You smoke. You have long-term (chronic) heart or lung disease. You have a weakened disease-fighting system (immune system). You have nasal allergies or asthma. You have a lot of stress. You have poor nutrition. What are  the signs or symptoms? Runny or stuffy (congested) nose. Cough. Sneezing. Sore throat. Headache. Feeling tired (fatigue). Fever. Not wanting to eat as much as usual. Pain in your forehead, behind your eyes, and over your cheekbones (sinus pain). Muscle aches. Redness or irritation of the eyes. Pressure in the ears or face. How is this treated? URIs usually get better on their own within 7-10 days. Medicines cannot cure URIs, but your doctor may recommend certain medicines to help relieve symptoms, such as: Over-the-counter cold medicines. Medicines to reduce coughing (cough suppressants).  Coughing is a type of defense against infection that helps to clear the nose, throat, windpipe, and lungs (respiratory system). Take these medicines only as told by your doctor. Medicines to lower your fever. Follow these instructions at home: Activity Rest as needed. If you have a fever, stay home from work or school until your fever is gone, or until your doctor says you may return to work or school. You should stay home until you cannot spread the infection anymore (you are not contagious). Your doctor may have you wear a face mask so you have less risk of spreading the infection. Relieving symptoms Rinse your mouth often with salt water. To make salt water, dissolve -1 tsp (3-6 g) of salt in 1 cup (237 mL) of warm water. Use a cool-mist humidifier to add moisture to the air. This can help you breathe more easily. Eating and drinking  Drink enough fluid to keep your pee (urine) pale yellow. Eat soups and other clear broths. General instructions  Take over-the-counter and prescription medicines only as told by your doctor. Do not smoke or use any products that contain nicotine or tobacco. If you need help quitting, ask your doctor. Avoid being where people are smoking (avoid secondhand smoke). Stay up to date on all your shots (immunizations), and get the flu shot every year. Keep all follow-up visits. How to prevent the spread of infection to others  Wash your hands with soap and water for at least 20 seconds. If you cannot use soap and water, use hand sanitizer. Avoid touching your mouth, face, eyes, or nose. Cough or sneeze into a tissue or your sleeve or elbow. Do not cough or sneeze into your hand or into the air. Contact a doctor if: You are getting worse, not better. You have any of these: A fever or chills. Brown or red mucus in your nose. Yellow or brown fluid (discharge)coming from your nose. Pain in your face, especially when you bend forward. Swollen neck  glands. Pain when you swallow. White areas in the back of your throat. Get help right away if: You have shortness of breath that gets worse. You have very bad or constant: Headache. Ear pain. Pain in your forehead, behind your eyes, and over your cheekbones (sinus pain). Chest pain. You have long-lasting (chronic) lung disease along with any of these: Making high-pitched whistling sounds when you breathe, most often when you breathe out (wheezing). Long-lasting cough (more than 14 days). Coughing up blood. A change in your usual mucus. You have a stiff neck. You have changes in your: Vision. Hearing. Thinking. Mood. These symptoms may be an emergency. Get help right away. Call 911. Do not wait to see if the symptoms will go away. Do not drive yourself to the hospital. Summary An upper respiratory infection (URI) is caused by a germ (virus). The most common type of URI is often called the common cold. URIs usually get better within 7-10 days.  Take over-the-counter and prescription medicines only as told by your doctor. This information is not intended to replace advice given to you by your health care provider. Make sure you discuss any questions you have with your health care provider. Document Revised: 01/26/2021 Document Reviewed: 01/26/2021 Elsevier Patient Education  2023 [employer]    If you have been instructed to have an in-person evaluation today at a local Urgent Care facility, please use the link below. It will take you to a list of all of our available [facility], including address, phone number and hours of operation. Please do not delay care.  [facility]  If you or a family member do not have a primary care provider, use the link below to schedule a visit and establish care. When you choose a [facility] primary care physician or advanced practice provider, you gain a long-term partner in health. Find a Primary Care Provider  Learn  more about [facility]'s in-office and virtual care options: [place] - Get Care Now

## 2022-02-06 ENCOUNTER — Encounter: Payer: Self-pay | Admitting: Physician Assistant
